# Patient Record
Sex: Female | Born: 1970
Health system: Southern US, Community
[De-identification: ages and names within clinical notes are randomized; demographics above are authoritative.]

## PROBLEM LIST (undated history)

## (undated) DIAGNOSIS — J439 Emphysema, unspecified: Secondary | ICD-10-CM

## (undated) DIAGNOSIS — K219 Gastro-esophageal reflux disease without esophagitis: Secondary | ICD-10-CM

## (undated) DIAGNOSIS — K589 Irritable bowel syndrome without diarrhea: Secondary | ICD-10-CM

## (undated) DIAGNOSIS — N200 Calculus of kidney: Secondary | ICD-10-CM

## (undated) DIAGNOSIS — E739 Lactose intolerance, unspecified: Secondary | ICD-10-CM

## (undated) DIAGNOSIS — Z975 Presence of (intrauterine) contraceptive device: Secondary | ICD-10-CM

## (undated) HISTORY — PX: INTRAUTERINE DEVICE (IUD) INSERTION: SHX5877

## (undated) HISTORY — DX: Emphysema, unspecified: J43.9

## (undated) HISTORY — PX: HERNIA REPAIR: SHX51

## (undated) HISTORY — PX: TONSILLECTOMY: SUR1361

## (undated) HISTORY — DX: Calculus of kidney: N20.0

## (undated) HISTORY — DX: Presence of (intrauterine) contraceptive device: Z97.5

## (undated) HISTORY — PX: SEPTOPLASTY: SUR1290

---

## 1998-02-20 ENCOUNTER — Other Ambulatory Visit: Admission: RE | Admit: 1998-02-20 | Discharge: 1998-02-20 | Payer: Self-pay | Admitting: *Deleted

## 1998-11-29 ENCOUNTER — Inpatient Hospital Stay (HOSPITAL_COMMUNITY): Admission: AD | Admit: 1998-11-29 | Discharge: 1998-12-02 | Payer: Self-pay | Admitting: Obstetrics and Gynecology

## 1999-01-01 ENCOUNTER — Encounter (HOSPITAL_COMMUNITY): Admission: RE | Admit: 1999-01-01 | Discharge: 1999-04-01 | Payer: Self-pay | Admitting: Obstetrics and Gynecology

## 1999-01-06 ENCOUNTER — Other Ambulatory Visit: Admission: RE | Admit: 1999-01-06 | Discharge: 1999-01-06 | Payer: Self-pay | Admitting: Obstetrics and Gynecology

## 1999-09-15 ENCOUNTER — Emergency Department (HOSPITAL_COMMUNITY): Admission: EM | Admit: 1999-09-15 | Discharge: 1999-09-15 | Payer: Self-pay | Admitting: *Deleted

## 1999-09-15 ENCOUNTER — Encounter: Payer: Self-pay | Admitting: *Deleted

## 2000-04-07 ENCOUNTER — Other Ambulatory Visit: Admission: RE | Admit: 2000-04-07 | Discharge: 2000-04-07 | Payer: Self-pay | Admitting: *Deleted

## 2001-06-22 ENCOUNTER — Other Ambulatory Visit: Admission: RE | Admit: 2001-06-22 | Discharge: 2001-06-22 | Payer: Self-pay | Admitting: *Deleted

## 2002-10-11 ENCOUNTER — Other Ambulatory Visit: Admission: RE | Admit: 2002-10-11 | Discharge: 2002-10-11 | Payer: Self-pay | Admitting: Obstetrics and Gynecology

## 2003-07-10 ENCOUNTER — Ambulatory Visit (HOSPITAL_COMMUNITY): Admission: RE | Admit: 2003-07-10 | Discharge: 2003-07-10 | Payer: Self-pay | Admitting: Surgery

## 2003-12-19 ENCOUNTER — Other Ambulatory Visit: Admission: RE | Admit: 2003-12-19 | Discharge: 2003-12-19 | Payer: Self-pay | Admitting: Obstetrics and Gynecology

## 2004-07-29 ENCOUNTER — Emergency Department: Payer: Self-pay | Admitting: Emergency Medicine

## 2005-07-02 ENCOUNTER — Emergency Department: Payer: Self-pay | Admitting: Emergency Medicine

## 2015-03-09 LAB — HM PAP SMEAR: HM Pap smear: NEGATIVE

## 2015-08-19 ENCOUNTER — Emergency Department
Admission: EM | Admit: 2015-08-19 | Discharge: 2015-08-19 | Disposition: A | Discharge status: Home / Self Care | Payer: BLUE CROSS/BLUE SHIELD | Source: Home / Self Care | Attending: Family Medicine | Admitting: Family Medicine

## 2015-08-19 ENCOUNTER — Encounter: Payer: Self-pay | Admitting: *Deleted

## 2015-08-19 DIAGNOSIS — J019 Acute sinusitis, unspecified: Secondary | ICD-10-CM | POA: Diagnosis not present

## 2015-08-19 HISTORY — DX: Irritable bowel syndrome, unspecified: K58.9

## 2015-08-19 MED ORDER — FLUTICASONE PROPIONATE 50 MCG/ACT NA SUSP: 2.0000 | Freq: Every day | NASAL | Status: DC

## 2015-08-19 MED ORDER — CEFDINIR 300 MG PO CAPS: 300.0000 mg | ORAL_CAPSULE | Freq: Two times a day (BID) | ORAL | Status: DC

## 2015-08-19 NOTE — ED Notes (Signed)
Pt c/o nasal congestion, HA, sneezing, ear fullness, and sinus pressure x 2 wks.

## 2015-08-19 NOTE — Discharge Instructions (Signed)
You may take 400-600mg Ibuprofen (Motrin) every 6-8 hours for fever and pain  °Alternate with Tylenol  °You may take 500mg Tylenol every 4-6 hours as needed for fever and pain  °Follow-up with your primary care provider next week for recheck of symptoms if not improving.  °Be sure to drink plenty of fluids and rest, at least 8hrs of sleep a night, preferably more while you are sick. °Return urgent care or go to closest ER if you cannot keep down fluids/signs of dehydration, fever not reducing with Tylenol, difficulty breathing/wheezing, stiff neck, worsening condition, or other concerns (see below)  °Please take antibiotics as prescribed and be sure to complete entire course even if you start to feel better to ensure infection does not come back. ° ° °Sinusitis, Adult °Sinusitis is redness, soreness, and inflammation of the paranasal sinuses. Paranasal sinuses are air pockets within the bones of your face. They are located beneath your eyes, in the middle of your forehead, and above your eyes. In healthy paranasal sinuses, mucus is able to drain out, and air is able to circulate through them by way of your nose. However, when your paranasal sinuses are inflamed, mucus and air can become trapped. This can allow bacteria and other germs to grow and cause infection. °Sinusitis can develop quickly and last only a short time (acute) or continue over a long period (chronic). Sinusitis that lasts for more than 12 weeks is considered chronic. °CAUSES °Causes of sinusitis include: °· Allergies. °· Structural abnormalities, such as displacement of the cartilage that separates your nostrils (deviated septum), which can decrease the air flow through your nose and sinuses and affect sinus drainage. °· Functional abnormalities, such as when the small hairs (cilia) that line your sinuses and help remove mucus do not work properly or are not present. °SIGNS AND SYMPTOMS °Symptoms of acute and chronic sinusitis are the same. The  primary symptoms are pain and pressure around the affected sinuses. Other symptoms include: °· Upper toothache. °· Earache. °· Headache. °· Bad breath. °· Decreased sense of smell and taste. °· A cough, which worsens when you are lying flat. °· Fatigue. °· Fever. °· Thick drainage from your nose, which often is green and may contain pus (purulent). °· Swelling and warmth over the affected sinuses. °DIAGNOSIS °Your health care provider will perform a physical exam. During your exam, your health care provider may perform any of the following to help determine if you have acute sinusitis or chronic sinusitis: °· Look in your nose for signs of abnormal growths in your nostrils (nasal polyps). °· Tap over the affected sinus to check for signs of infection. °· View the inside of your sinuses using an imaging device that has a light attached (endoscope). °If your health care provider suspects that you have chronic sinusitis, one or more of the following tests may be recommended: °· Allergy tests. °· Nasal culture. A sample of mucus is taken from your nose, sent to a lab, and screened for bacteria. °· Nasal cytology. A sample of mucus is taken from your nose and examined by your health care provider to determine if your sinusitis is related to an allergy. °TREATMENT °Most cases of acute sinusitis are related to a viral infection and will resolve on their own within 10 days. Sometimes, medicines are prescribed to help relieve symptoms of both acute and chronic sinusitis. These may include pain medicines, decongestants, nasal steroid sprays, or saline sprays. °However, for sinusitis related to a bacterial infection, your health   care provider will prescribe antibiotic medicines. These are medicines that will help kill the bacteria causing the infection. °Rarely, sinusitis is caused by a fungal infection. In these cases, your health care provider will prescribe antifungal medicine. °For some cases of chronic sinusitis, surgery  is needed. Generally, these are cases in which sinusitis recurs more than 3 times per year, despite other treatments. °HOME CARE INSTRUCTIONS °· Drink plenty of water. Water helps thin the mucus so your sinuses can drain more easily. °· Use a humidifier. °· Inhale steam 3-4 times a day (for example, sit in the bathroom with the shower running). °· Apply a warm, moist washcloth to your face 3-4 times a day, or as directed by your health care provider. °· Use saline nasal sprays to help moisten and clean your sinuses. °· Take medicines only as directed by your health care provider. °· If you were prescribed either an antibiotic or antifungal medicine, finish it all even if you start to feel better. °SEEK IMMEDIATE MEDICAL CARE IF: °· You have increasing pain or severe headaches. °· You have nausea, vomiting, or drowsiness. °· You have swelling around your face. °· You have vision problems. °· You have a stiff neck. °· You have difficulty breathing. °  °This information is not intended to replace advice given to you by your health care provider. Make sure you discuss any questions you have with your health care provider. °  °Document Released: 07/18/2005 Document Revised: 08/08/2014 Document Reviewed: 08/02/2011 °Elsevier Interactive Patient Education ©2016 [employer] ° °Sinus Rinse °WHAT IS A SINUS RINSE? °A sinus rinse is a home treatment. It rinses your sinuses with a mixture of salt and water (saline solution). Sinuses are air-filled spaces in your skull behind the bones of your face and forehead. They open into your nasal cavity. °To do a sinus rinse, you will need: °· Saline solution. °· Neti pot or spray bottle. This releases the saline solution into your nose and through your sinuses. You can buy neti pots and spray bottles at: °¨ Your local pharmacy. °¨ A health food store. °¨ Online. °WHEN WOULD I DO A SINUS RINSE?  °A sinus rinse can help to clear your nasal cavity. It can clear:   °· Mucus. °· Dirt. °· Dust. °· Pollen. °You may do a sinus rinse when you have: °· A cold. °· A virus. °· Allergies. °· A sinus infection. °· A stuffy nose. °If you are considering a sinus rinse: °· Ask your child's doctor before doing a sinus rinse on your child. °· Do not do a sinus rinse if you have had: °¨ Ear or nasal surgery. °¨ An ear infection. °¨ Blocked ears. °HOW DO I DO A SINUS RINSE?  °· Wash your hands. °· Disinfect your device using the directions that came with the device. °· Dry your device. °· Use the solution that comes with your device or one that is sold separately in stores. Follow the mixing directions on the package. °· Fill your device with the amount of saline solution as stated in the device instructions. °· Stand over a sink and tilt your head sideways over the sink. °· Place the spout of the device in your upper nostril (the one closer to the ceiling). °· Gently pour or squeeze the saline solution into the nasal cavity. The liquid should drain to the lower nostril if you are not too congested. °· Gently blow your nose. Blowing too hard may cause ear pain. °· Repeat in the other   nostril. °· Clean and rinse your device with clean water. °· Air-dry your device. °ARE THERE RISKS OF A SINUS RINSE?  °Sinus rinse is normally very safe and helpful. However, there are a few risks, which include:  °· A burning feeling in the sinuses. This may happen if you do not make the saline solution as instructed. Make sure to follow all directions when making the saline solution. °· Infection from unclean water. This is rare, but possible. °· Nasal irritation. °  °This information is not intended to replace advice given to you by your health care provider. Make sure you discuss any questions you have with your health care provider. °  °Document Released: 02/12/2014 Document Reviewed: 02/12/2014 °Elsevier Interactive Patient Education ©2016 [employer] ° ° °

## 2015-08-19 NOTE — ED Provider Notes (Signed)
CSN: [id number]     Arrival date & time 08/19/15  1239 History   First MD Initiated Contact with Patient 08/19/15 1257     Chief Complaint  Patient presents with  . Nasal Congestion   (Consider location/radiation/quality/duration/timing/severity/associated sxs/prior Treatment) HPI Pt is a 45yo female presenting to [facility] with c/o nasal congestion, intermittent headaches that are worse when leaning forward, sinus pressure, ear fullness and sneezing.  She reports she was on amoxicillin  TID for 7 days and did improve but states symptoms worsened 2 days later.  Initially symptoms started about 2 weeks ago.  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 She has had subjective fever with chills, mild nausea without vomiting and loose stools she believes is due to the nasal drainage.  She notes other family members are also sick.  Past Medical History  Diagnosis Date  . IBS (irritable bowel syndrome)    Past Surgical History  Procedure Laterality Date  . Tonsillectomy    . Hernia repair    . Septoplasty     Family History  Problem Relation Age of Onset  . Hypertension Mother   . Hypertension Father   . Thyroid disease Sister    Social History  Substance Use Topics  . Smoking status: Current Every Day Smoker -- 0.50 packs/day    Types: Cigarettes  . Smokeless tobacco: None  . Alcohol Use: Yes     Comment: 1-2 q wk   OB History    No data available     Review of Systems  Constitutional: Positive for fever ( subjective), chills and fatigue.  HENT: Positive for congestion, ear pain (Left), rhinorrhea and sore throat. Negative for trouble swallowing and voice change.   Respiratory: Positive for cough. Negative for shortness of breath.   Cardiovascular: Negative for chest pain and palpitations.  Gastrointestinal: Positive for nausea and diarrhea ( loose stool). Negative for  vomiting and abdominal pain.  Musculoskeletal: Positive for myalgias and arthralgias. Negative for back pain.  Skin: Negative for rash.  Neurological: Positive for dizziness and headaches. Negative for light-headedness.    Allergies  Codeine and Erythromycin  Home Medications   Prior to Admission medications   Medication Sig Start Date End Date Taking? Authorizing Provider  dicyclomine (BENTYL) 10 MG capsule Take 10 mg by mouth 4 (four) times daily -  before meals and at bedtime.   Yes Historical Provider, MD  loratadine (CLARITIN) 10 MG tablet Take 10 mg by mouth daily.   Yes Historical Provider, MD  mirabegron ER (MYRBETRIQ) 25 MG TB24 tablet Take 25 mg by mouth daily.   Yes Historical Provider, MD  phenylephrine (SUDAFED PE) 10 MG TABS tablet Take 10 mg by mouth every 4 (four) hours as needed.   Yes Historical Provider, MD  Probiotic Product (ALIGN PO) Take by mouth.   Yes Historical Provider, MD  cefdinir (OMNICEF) 300 MG capsule Take 1 capsule (300 mg total) by mouth 2 (two) times daily. For 10 days 08/19/15   Junius Finner, PA-C  fluticasone [facility]) 50 MCG/ACT nasal spray Place 2 sprays into both nostrils daily. Use daily for at least 2 weeks, then seasonally as needed. 08/19/15   Junius Finner, PA-C   Meds Ordered and Administered this Visit  Medications - No data to display  BP 111/80 mmHg  Pulse 91  Temp(Src) 98.1 F (36.7 C) (Oral)  Resp 16  Ht  (1.6 m)  Wt 130 lb (58.968 kg)  BMI 23.03 kg/m2  SpO2 98%  LMP 07/23/2015 No data found.   Physical Exam  Constitutional: She appears well-developed and well-nourished. No distress.  HENT:  Head: Normocephalic and atraumatic.  Right Ear: Hearing, tympanic membrane, external ear and ear canal normal.  Left Ear: Hearing, tympanic membrane, external ear and ear canal normal.  Nose: Mucosal edema ( Right side worse than Left) present. Right sinus exhibits maxillary sinus tenderness. Right sinus exhibits no frontal sinus  tenderness. Left sinus exhibits maxillary sinus tenderness and frontal sinus tenderness.  Mouth/Throat: Uvula is midline and mucous membranes are normal. Posterior oropharyngeal erythema present. No oropharyngeal exudate, posterior oropharyngeal edema or tonsillar abscesses.  Eyes: Conjunctivae are normal. No scleral icterus.  Neck: Normal range of motion. Neck supple.  Cardiovascular: Normal rate, regular rhythm and normal heart sounds.   Pulmonary/Chest: Effort normal and breath sounds normal. No respiratory distress. She has no wheezes. She has no rales. She exhibits no tenderness.  Abdominal: Soft. She exhibits no distension and no mass. There is no tenderness. There is no rebound and no guarding.  Musculoskeletal: Normal range of motion.  Lymphadenopathy:    She has cervical adenopathy.  Neurological: She is alert.  Skin: Skin is warm and dry. She is not diaphoretic.  Nursing note and vitals reviewed.   ED Course  Procedures (including critical care time)  Labs Review Labs Reviewed - No data to display  Imaging Review No results found.    MDM   1. Acute rhinosinusitis    Pt c/o worsening sinus congestion and pressure after completing a 7 day course of Amoxicillin.  Due to reported fevers and worsening facial pressure, will start pt on Cefdinir.  Advised pt she can use Flonase daily and it works best if it is used consistently.  Advised pt to use acetaminophen and ibuprofen as needed for fever and pain. Encouraged rest and fluids. F/u with PCP in 7-10 days if not improving, sooner if worsening. Pt verbalized understanding and agreement with tx plan.    Junius Finner, PA-C 08/19/15 1346

## 2016-05-16 DIAGNOSIS — Z6822 Body mass index (BMI) 22.0-22.9, adult: Secondary | ICD-10-CM | POA: Diagnosis not present

## 2016-05-16 DIAGNOSIS — Z01419 Encounter for gynecological examination (general) (routine) without abnormal findings: Secondary | ICD-10-CM | POA: Diagnosis not present

## 2016-05-16 DIAGNOSIS — Z0000 Encounter for general adult medical examination without abnormal findings: Secondary | ICD-10-CM | POA: Diagnosis not present

## 2016-06-20 DIAGNOSIS — Z1231 Encounter for screening mammogram for malignant neoplasm of breast: Secondary | ICD-10-CM | POA: Diagnosis not present

## 2016-06-20 LAB — HM MAMMOGRAPHY

## 2017-01-02 DIAGNOSIS — Z3202 Encounter for pregnancy test, result negative: Secondary | ICD-10-CM | POA: Diagnosis not present

## 2017-01-02 DIAGNOSIS — Z30433 Encounter for removal and reinsertion of intrauterine contraceptive device: Secondary | ICD-10-CM | POA: Diagnosis not present

## 2017-01-30 ENCOUNTER — Encounter: Payer: Self-pay | Admitting: *Deleted

## 2017-01-30 ENCOUNTER — Emergency Department
Admission: EM | Admit: 2017-01-30 | Discharge: 2017-01-30 | Disposition: A | Discharge status: Home / Self Care | Payer: BLUE CROSS/BLUE SHIELD | Source: Home / Self Care | Attending: Family Medicine | Admitting: Family Medicine

## 2017-01-30 DIAGNOSIS — R350 Frequency of micturition: Secondary | ICD-10-CM | POA: Diagnosis not present

## 2017-01-30 LAB — POCT URINALYSIS DIP (MANUAL ENTRY)
BILIRUBIN UA: NEGATIVE
BILIRUBIN UA: NEGATIVE mg/dL
Blood, UA: NEGATIVE
GLUCOSE UA: NEGATIVE mg/dL
Leukocytes, UA: NEGATIVE
Nitrite, UA: NEGATIVE
Protein Ur, POC: NEGATIVE mg/dL
SPEC GRAV UA: 1.015 (ref 1.010–1.025)
Urobilinogen, UA: 0.2 E.U./dL
pH, UA: 6.5 (ref 5.0–8.0)

## 2017-01-30 MED ORDER — NITROFURANTOIN MONOHYD MACRO 100 MG PO CAPS: 100.0000 mg | ORAL_CAPSULE | Freq: Two times a day (BID) | ORAL | 0 refills | Status: DC

## 2017-01-30 NOTE — Discharge Instructions (Signed)
Increase fluid intake.  Begin antibiotic if urinary burning and pain occur. If symptoms become significantly worse during the night or over the weekend, proceed to the local emergency room.

## 2017-01-30 NOTE — ED Provider Notes (Signed)
[facility]    CSN: [id number] Arrival date & time: 01/30/17  1415     History   Chief Complaint Chief Complaint  Patient presents with  . Flank Pain  . Fatigue  . Pelvic Pain    HPI Raven Foster is a 46 y.o. female.   Patient awoke two days ago with fatigue and left lower back pain that radiated to her lower abdomen.  She has had increased urinary frequency and urgency.  She had nausea today (no vomiting) and possibly chills.  She has an IUD in place, and had some spotting on June 4.  She has a history of stone in her left kidney.   The history is provided by the patient.  Flank Pain  This is a new problem. The current episode started more than 2 days ago. The problem occurs constantly. The problem has been gradually improving. Pertinent negatives include no abdominal pain. Nothing aggravates the symptoms. Nothing relieves the symptoms. She has tried nothing for the symptoms.  Pelvic Pain  Pertinent negatives include no abdominal pain.    Past Medical History:  Diagnosis Date  . IBS (irritable bowel syndrome)     There are no active problems to display for this patient.   Past Surgical History:  Procedure Laterality Date  . HERNIA REPAIR    . SEPTOPLASTY    . TONSILLECTOMY      OB History    No data available       Home Medications    Prior to Admission medications   Medication Sig Start Date End Date Taking? Authorizing Provider  cyclobenzaprine (FLEXERIL) 5 MG tablet Take 5 mg by mouth 3 (three) times daily as needed for muscle spasms.   Yes [provider]  dicyclomine (BENTYL) 10 MG capsule Take 10 mg by mouth 4 (four) times daily -  before meals and at bedtime.   Yes [provider]  loratadine (CLARITIN) 10 MG tablet Take 10 mg by mouth daily.    [provider]  mirabegron ER (MYRBETRIQ) 25 MG TB24 tablet Take 25 mg by mouth daily.    [provider]  nitrofurantoin, macrocrystal-monohydrate,  (MACROBID) 100 MG capsule Take 1 capsule (100 mg total) by mouth 2 (two) times daily. Take with food. 01/30/17   Lattie HawBeese, Stephen A, MD  Probiotic Product (ALIGN PO) Take by mouth.    [provider]    Family History Family History  Problem Relation Age of Onset  . Hypertension Mother   . Hypertension Father   . Thyroid disease Sister     Social History Social History  Substance Use Topics  . Smoking status: Current Every Day Smoker    Packs/day: 0.50    Types: Cigarettes  . Smokeless tobacco: Current User  . Alcohol use Yes     Comment: 1-2 q wk     Allergies   Codeine and Erythromycin   Review of Systems Review of Systems  Gastrointestinal: Negative for abdominal pain.  Genitourinary: Positive for dysuria, flank pain, frequency, pelvic pain and urgency. Negative for hematuria, vaginal bleeding and vaginal discharge.  All other systems reviewed and are negative.    Physical Exam Triage Vital Signs ED Triage Vitals [01/30/17 1500]  Enc Vitals Group     BP 106/72     Pulse Rate 68     Resp      Temp 99.1 F (37.3 C)     Temp Source Oral     SpO2 100 %  Weight 133 lb (60.3 kg)     Height      Head Circumference      Peak Flow      Pain Score 4     Pain Loc      Pain Edu?      Excl. in GC?    No data found.   Updated Vital Signs BP 106/72 (BP Location: Left Arm)   Pulse 68   Temp 99.1 F (37.3 C) (Oral)   Wt 133 lb (60.3 kg)   SpO2 100%   BMI 23.56 kg/m   Visual Acuity Right Eye Distance:   Left Eye Distance:   Bilateral Distance:    Right Eye Near:   Left Eye Near:    Bilateral Near:     Physical Exam Nursing notes and Vital Signs reviewed. Appearance:  Patient appears stated age, and in no acute distress.    Eyes:  Pupils are equal, round, and reactive to light and accomodation.  Extraocular movement is intact.  Conjunctivae are not inflamed   Pharynx:  Normal; moist mucous membranes  Neck:  Supple.  No adenopathy Lungs:   Clear to auscultation.  Breath sounds are equal.  Moving air well. Heart:  Regular rate and rhythm without murmurs, rubs, or gallops.  Abdomen:   Mild tenderness over bladder without masses or hepatosplenomegaly.  Bowel sounds are present.  No CVA or flank tenderness.  Extremities:  No edema.  Skin:  No rash present.     UC Treatments / Results  Labs (all labs ordered are listed, but only abnormal results are displayed) Labs Reviewed  POCT URINALYSIS DIP (MANUAL ENTRY) negative    EKG  EKG Interpretation None       Radiology No results found.  Procedures Procedures (including critical care time)  Medications Ordered in UC Medications - No data to display   Initial Impression / Assessment and Plan / UC Course  I have reviewed the triage vital signs and the nursing notes.  Pertinent labs & imaging results that were available during my care of the patient were reviewed by me and considered in my medical decision making (see chart for details).    Note normal urinalysis.  No evidence UTI today.  Urine culture pending. Given Macrobid (Rx to hold); await urine culture results. Increase fluid intake.  Begin antibiotic if urinary burning and pain occur. If symptoms become significantly worse during the night or over the weekend, proceed to the local emergency room.  Followup with Family Doctor if not improved in one week.     Final Clinical Impressions(s) / UC Diagnoses   Final diagnoses:  Urinary frequency    New Prescriptions New Prescriptions   NITROFURANTOIN, MACROCRYSTAL-MONOHYDRATE, (MACROBID) 100 MG CAPSULE    Take 1 capsule (100 mg total) by mouth 2 (two) times daily. Take with food.     Lattie Haw, MD 02/07/17 1910

## 2017-01-30 NOTE — ED Triage Notes (Signed)
Patient c/o left flank pain x 2 days, urinary frequency and now bladder pain. She noted excessive fatigue starting 2 days ago. Her urologist Dr. Benancio DeedsNewsome told her she has a stone in the left kidney.

## 2017-02-01 ENCOUNTER — Telehealth: Payer: Self-pay

## 2017-02-01 LAB — URINE CULTURE: Organism ID, Bacteria: NO GROWTH

## 2017-02-01 NOTE — Telephone Encounter (Signed)
Feeling better.  UCX results given.

## 2017-02-20 DIAGNOSIS — R1031 Right lower quadrant pain: Secondary | ICD-10-CM | POA: Diagnosis not present

## 2017-02-20 DIAGNOSIS — Z30431 Encounter for routine checking of intrauterine contraceptive device: Secondary | ICD-10-CM | POA: Diagnosis not present

## 2017-02-20 DIAGNOSIS — R1903 Right lower quadrant abdominal swelling, mass and lump: Secondary | ICD-10-CM | POA: Diagnosis not present

## 2017-05-29 DIAGNOSIS — Z9189 Other specified personal risk factors, not elsewhere classified: Secondary | ICD-10-CM | POA: Diagnosis not present

## 2017-05-29 DIAGNOSIS — Z01419 Encounter for gynecological examination (general) (routine) without abnormal findings: Secondary | ICD-10-CM | POA: Diagnosis not present

## 2017-05-29 DIAGNOSIS — Z0000 Encounter for general adult medical examination without abnormal findings: Secondary | ICD-10-CM | POA: Diagnosis not present

## 2017-05-29 DIAGNOSIS — Z6823 Body mass index (BMI) 23.0-23.9, adult: Secondary | ICD-10-CM | POA: Diagnosis not present

## 2017-06-06 DIAGNOSIS — J300 Vasomotor rhinitis: Secondary | ICD-10-CM | POA: Diagnosis not present

## 2017-06-06 DIAGNOSIS — J310 Chronic rhinitis: Secondary | ICD-10-CM | POA: Diagnosis not present

## 2017-08-11 ENCOUNTER — Emergency Department
Admission: EM | Admit: 2017-08-11 | Discharge: 2017-08-11 | Disposition: A | Discharge status: Home / Self Care | Payer: BLUE CROSS/BLUE SHIELD | Source: Home / Self Care | Attending: Family Medicine | Admitting: Family Medicine

## 2017-08-11 ENCOUNTER — Encounter: Payer: Self-pay | Admitting: Emergency Medicine

## 2017-08-11 DIAGNOSIS — J069 Acute upper respiratory infection, unspecified: Secondary | ICD-10-CM | POA: Diagnosis not present

## 2017-08-11 DIAGNOSIS — R053 Chronic cough: Secondary | ICD-10-CM

## 2017-08-11 DIAGNOSIS — R05 Cough: Secondary | ICD-10-CM

## 2017-08-11 MED ORDER — CEFDINIR 300 MG PO CAPS: 300.0000 mg | ORAL_CAPSULE | Freq: Two times a day (BID) | ORAL | 0 refills | Status: DC

## 2017-08-11 MED ORDER — PREDNISONE 20 MG PO TABS: ORAL_TABLET | ORAL | 0 refills | Status: DC

## 2017-08-11 MED ORDER — ALBUTEROL SULFATE HFA 108 (90 BASE) MCG/ACT IN AERS: 1.0000 | INHALATION_SPRAY | Freq: Four times a day (QID) | RESPIRATORY_TRACT | 0 refills | Status: DC | PRN

## 2017-08-11 MED ORDER — GUAIFENESIN 100 MG/5ML PO LIQD: 200.0000 mg | ORAL | 0 refills | Status: DC | PRN

## 2017-08-11 NOTE — ED Triage Notes (Signed)
Pt c/o facial pain, cough with mucous and congestion for the last 2 days. Afebrile.

## 2017-08-11 NOTE — ED Provider Notes (Signed)
[facility]    CSN: [id number] Arrival date & time: 08/11/17  1444     History   Chief Complaint Chief Complaint  Patient presents with  . Facial Pain    HPI Raven Foster is a 47 y.o. female.   HPI Raven Foster is a 47 y.o. female presenting to UC with c/o several weeks of nasal congestion, scratchy throat and mildly productive cough.  She was started on Azithromycin mid-December by her dentist.  Symptoms improved but never completely resolved.  Denies fever, chills, n/v/d. She is requesting to try prednisone and liquid Mucinex.     Past Medical History:  Diagnosis Date  . IBS (irritable bowel syndrome)     There are no active problems to display for this patient.   Past Surgical History:  Procedure Laterality Date  . HERNIA REPAIR    . SEPTOPLASTY    . TONSILLECTOMY      OB History    No data available       Home Medications    Prior to Admission medications   Medication Sig Start Date End Date Taking? Authorizing Provider  albuterol (PROVENTIL HFA;VENTOLIN HFA) 108 (90 Base) MCG/ACT inhaler Inhale 1-2 puffs into the lungs every 6 (six) hours as needed for wheezing or shortness of breath. 08/11/17   Lurene ShadowPhelps, Theordore Cisnero O, PA-C  cefdinir (OMNICEF) 300 MG capsule Take 1 capsule (300 mg total) by mouth 2 (two) times daily. 08/11/17   Lurene ShadowPhelps, Praise Stennett O, PA-C  cyclobenzaprine (FLEXERIL) 5 MG tablet Take 5 mg by mouth 3 (three) times daily as needed for muscle spasms.    [provider]  dicyclomine (BENTYL) 10 MG capsule Take 10 mg by mouth 4 (four) times daily -  before meals and at bedtime.    [provider]  guaiFENesin (ROBITUSSIN) 100 MG/5ML liquid Take 10-20 mLs (200-400 mg total) by mouth every 4 (four) hours as needed for cough or congestion. 08/11/17   Lurene ShadowPhelps, Alexandre Faries O, PA-C  loratadine (CLARITIN) 10 MG tablet Take 10 mg by mouth daily.    [provider]  mirabegron ER (MYRBETRIQ) 25 MG TB24 tablet Take 25 mg by mouth  daily.    [provider]  nitrofurantoin, macrocrystal-monohydrate, (MACROBID) 100 MG capsule Take 1 capsule (100 mg total) by mouth 2 (two) times daily. Take with food. 01/30/17   Lattie HawBeese, Stephen A, MD  predniSONE (DELTASONE) 20 MG tablet 3 tabs po day one, then 2 po daily x 4 days 08/11/17   Lurene ShadowPhelps, Linzi Ohlinger O, PA-C  Probiotic Product (ALIGN PO) Take by mouth.    [provider]    Family History Family History  Problem Relation Age of Onset  . Hypertension Mother   . Hypertension Father   . Thyroid disease Sister     Social History Social History   Tobacco Use  . Smoking status: Current Every Day Smoker    Packs/day: 0.50    Types: Cigarettes  . Smokeless tobacco: Current User  Substance Use Topics  . Alcohol use: Yes    Comment: 1-2 q wk  . Drug use: No     Allergies   Codeine; Erythromycin; and Prednisone   Review of Systems Review of Systems  Constitutional: Negative for chills and fever.  HENT: Positive for congestion, postnasal drip, rhinorrhea, sinus pressure and sore throat (scratchy). Negative for ear pain, sinus pain, trouble swallowing and voice change.   Respiratory: Positive for cough. Negative for shortness of breath.   Cardiovascular: Negative for  chest pain and palpitations.  Gastrointestinal: Negative for abdominal pain, diarrhea, nausea and vomiting.  Musculoskeletal: Negative for arthralgias, back pain and myalgias.  Skin: Negative for rash.  Neurological: Negative for dizziness, light-headedness and headaches.     Physical Exam Triage Vital Signs ED Triage Vitals  Enc Vitals Group     BP 08/11/17 1516 127/82     Pulse Rate 08/11/17 1516 76     Resp --      Temp 08/11/17 1516 97.8 F (36.6 C)     Temp Source 08/11/17 1516 Oral     SpO2 08/11/17 1516 100 %     Weight 08/11/17 1516 132 lb (59.9 kg)     Height --      Head Circumference --      Peak Flow --      Pain Score 08/11/17 1517 0     Pain Loc --      Pain Edu? --       Excl. in GC? --    No data found.  Updated Vital Signs BP 127/82 (BP Location: Right Arm)   Pulse 76   Temp 97.8 F (36.6 C) (Oral)   Wt 132 lb (59.9 kg)   SpO2 100%   BMI 23.38 kg/m   Visual Acuity Right Eye Distance:   Left Eye Distance:   Bilateral Distance:    Right Eye Near:   Left Eye Near:    Bilateral Near:     Physical Exam  Constitutional: She is oriented to person, place, and time. She appears well-developed and well-nourished. No distress.  HENT:  Head: Normocephalic and atraumatic.  Right Ear: Tympanic membrane normal.  Left Ear: Tympanic membrane normal.  Nose: Nose normal. Right sinus exhibits no maxillary sinus tenderness and no frontal sinus tenderness. Left sinus exhibits no maxillary sinus tenderness and no frontal sinus tenderness.  Mouth/Throat: Uvula is midline, oropharynx is clear and moist and mucous membranes are normal.  Eyes: EOM are normal.  Neck: Normal range of motion. Neck supple.  Cardiovascular: Normal rate and regular rhythm.  Pulmonary/Chest: Effort normal and breath sounds normal. No stridor. No respiratory distress. She has no wheezes. She has no rales.  Musculoskeletal: Normal range of motion.  Lymphadenopathy:    She has no cervical adenopathy.  Neurological: She is alert and oriented to person, place, and time.  Skin: Skin is warm and dry. She is not diaphoretic.  Psychiatric: She has a normal mood and affect. Her behavior is normal.  Nursing note and vitals reviewed.    UC Treatments / Results  Labs (all labs ordered are listed, but only abnormal results are displayed) Labs Reviewed - No data to display  EKG  EKG Interpretation None       Radiology No results found.  Procedures Procedures (including critical care time)  Medications Ordered in UC Medications - No data to display   Initial Impression / Assessment and Plan / UC Course  I have reviewed the triage vital signs and the nursing notes.  Pertinent  labs & imaging results that were available during my care of the patient were reviewed by me and considered in my medical decision making (see chart for details).    Pt recently completed a course of Azithromycin Will try symptomatic treatment first Prescription to hold with expiration date for cefdinir. Pt to fill if persistent fever develops or not improving in 1 week.  F/u with PCP in 1-2 weeks if needed.   Final Clinical Impressions(s) / UC  Diagnoses   Final diagnoses:  Acute upper respiratory infection  Persistent cough    ED Discharge Orders        Ordered    predniSONE (DELTASONE) 20 MG tablet     08/11/17 1606    guaiFENesin (ROBITUSSIN) 100 MG/5ML liquid  Every 4 hours PRN     08/11/17 1606    albuterol (PROVENTIL HFA;VENTOLIN HFA) 108 (90 Base) MCG/ACT inhaler  Every 6 hours PRN     08/11/17 1606    cefdinir (OMNICEF) 300 MG capsule  2 times daily    Comments:  Void after 08/30/17   08/11/17 1606       Controlled Substance Prescriptions  Controlled Substance Registry consulted? Not Applicable   Rolla Plate 08/11/17 1622

## 2017-08-31 ENCOUNTER — Emergency Department (INDEPENDENT_AMBULATORY_CARE_PROVIDER_SITE_OTHER): Payer: BLUE CROSS/BLUE SHIELD

## 2017-08-31 ENCOUNTER — Emergency Department
Admission: EM | Admit: 2017-08-31 | Discharge: 2017-08-31 | Disposition: A | Discharge status: Home / Self Care | Payer: BLUE CROSS/BLUE SHIELD | Source: Home / Self Care | Attending: Emergency Medicine | Admitting: Emergency Medicine

## 2017-08-31 ENCOUNTER — Other Ambulatory Visit: Payer: Self-pay

## 2017-08-31 ENCOUNTER — Encounter: Payer: Self-pay | Admitting: Emergency Medicine

## 2017-08-31 DIAGNOSIS — J439 Emphysema, unspecified: Secondary | ICD-10-CM | POA: Diagnosis not present

## 2017-08-31 DIAGNOSIS — T783XXA Angioneurotic edema, initial encounter: Secondary | ICD-10-CM

## 2017-08-31 DIAGNOSIS — Q676 Pectus excavatum: Secondary | ICD-10-CM | POA: Diagnosis not present

## 2017-08-31 DIAGNOSIS — R05 Cough: Secondary | ICD-10-CM | POA: Diagnosis not present

## 2017-08-31 DIAGNOSIS — R9389 Abnormal findings on diagnostic imaging of other specified body structures: Secondary | ICD-10-CM

## 2017-08-31 HISTORY — DX: Gastro-esophageal reflux disease without esophagitis: K21.9

## 2017-08-31 HISTORY — DX: Lactose intolerance, unspecified: E73.9

## 2017-08-31 LAB — POCT CBC W AUTO DIFF (K'VILLE URGENT CARE)

## 2017-08-31 LAB — COMPREHENSIVE METABOLIC PANEL
AG RATIO: 1.7 (calc) (ref 1.0–2.5)
ALKALINE PHOSPHATASE (APISO): 64 U/L (ref 33–115)
ALT: 13 U/L (ref 6–29)
AST: 15 U/L (ref 10–35)
Albumin: 4.5 g/dL (ref 3.6–5.1)
BUN: 8 mg/dL (ref 7–25)
CHLORIDE: 106 mmol/L (ref 98–110)
CO2: 27 mmol/L (ref 20–32)
Calcium: 10.3 mg/dL — ABNORMAL HIGH (ref 8.6–10.2)
Creat: 0.66 mg/dL (ref 0.50–1.10)
GLUCOSE: 96 mg/dL (ref 65–99)
Globulin: 2.6 g/dL (calc) (ref 1.9–3.7)
Potassium: 4.9 mmol/L (ref 3.5–5.3)
Sodium: 138 mmol/L (ref 135–146)
Total Bilirubin: 0.6 mg/dL (ref 0.2–1.2)
Total Protein: 7.1 g/dL (ref 6.1–8.1)

## 2017-08-31 MED ORDER — FAMOTIDINE 20 MG PO TABS
20.0000 mg | ORAL_TABLET | Freq: Every day | ORAL | Status: DC
  Administered 2017-08-31 (×3): 20 mg via ORAL

## 2017-08-31 NOTE — ED Triage Notes (Signed)
Patient has ongoing congestion in her chest which she was treated for 08/11/17; yesterday noticed edema upper lip around 1430; no new allergens with possible exception of new brand yogurt. Has not taken benadryl. No dyspnea.

## 2017-08-31 NOTE — Discharge Instructions (Addendum)
Take Claritin 10 mg 1 a day. Take Pepcid 20 mg 1 a day. Apply ice to your upper lip as needed for swelling. Please go to the emergency room for increased swelling or respiratory problems.

## 2017-08-31 NOTE — ED Provider Notes (Addendum)
[facility]    CSN: [id number] Arrival date & time: 08/31/17  0857     History   Chief Complaint Chief Complaint  Patient presents with  . Oral Swelling    upper lip  . Nasal Congestion    HPI Raven Foster is a 47 y.o. female.  Patient has been under treatment for respiratory infections off and on for the last 2 months. In December she took a Z-Pak. Following this she was seen and treated with prednisone and Omnicef for a respiratory tract infection. She seemed to be doing well and recently underwent allergy testing which was all negative. Yesterday at work at 2:00 she had a new type of yogurt with pomegranate she bought from [employer]. This was a new product for her. Proximally 30 minutes later she noted swelling of her left upper lip. She was able to finish work. She brings with her pictures with her fall of the progression of the swelling she develop. By evening yewhich are likely present which would order for the blood work at chest x-raysterday the swelling was more severe and early this morning she had swelling which extended over the entire upper lip. She has not had any stridor or shortness of breath. She denies cough with this. She has not had any urticaria.  HPI  Past Medical History:  Diagnosis Date  . GERD (gastroesophageal reflux disease)   . IBS (irritable bowel syndrome)   . Lactose intolerance     There are no active problems to display for this patient.   Past Surgical History:  Procedure Laterality Date  . HERNIA REPAIR    . SEPTOPLASTY    . TONSILLECTOMY      OB History    No data available       Home Medications    Prior to Admission medications   Medication Sig Start Date End Date Taking? Authorizing Provider  albuterol (PROVENTIL HFA;VENTOLIN HFA) 108 (90 Base) MCG/ACT inhaler Inhale 1-2 puffs into the lungs every 6 (six) hours as needed for wheezing or shortness of breath. 08/11/17   Lurene ShadowPhelps, Erin O, PA-C  cefdinir (OMNICEF) 300  MG capsule Take 1 capsule (300 mg total) by mouth 2 (two) times daily. 08/11/17   Lurene ShadowPhelps, Erin O, PA-C  cyclobenzaprine (FLEXERIL) 5 MG tablet Take 5 mg by mouth 3 (three) times daily as needed for muscle spasms.    [provider]  dicyclomine (BENTYL) 10 MG capsule Take 10 mg by mouth 4 (four) times daily -  before meals and at bedtime.    [provider]  guaiFENesin (ROBITUSSIN) 100 MG/5ML liquid Take 10-20 mLs (200-400 mg total) by mouth every 4 (four) hours as needed for cough or congestion. 08/11/17   Lurene ShadowPhelps, Erin O, PA-C  loratadine (CLARITIN) 10 MG tablet Take 10 mg by mouth daily.    [provider]  mirabegron ER (MYRBETRIQ) 25 MG TB24 tablet Take 25 mg by mouth daily.    [provider]  nitrofurantoin, macrocrystal-monohydrate, (MACROBID) 100 MG capsule Take 1 capsule (100 mg total) by mouth 2 (two) times daily. Take with food. 01/30/17   Lattie HawBeese, Stephen A, MD  predniSONE (DELTASONE) 20 MG tablet 3 tabs po day one, then 2 po daily x 4 days 08/11/17   Lurene ShadowPhelps, Erin O, PA-C  Probiotic Product (ALIGN PO) Take by mouth.    [provider]    Family History Family History  Problem Relation Age of Onset  . Hypertension Mother   .  Hypertension Father   . Thyroid disease Sister     Social History Social History   Tobacco Use  . Smoking status: Current Every Day Smoker    Packs/day: 0.50    Types: Cigarettes  . Smokeless tobacco: Current User  Substance Use Topics  . Alcohol use: Yes    Comment: 1-2 q wk  . Drug use: No     Allergies   Patient has no active allergies.   Review of Systems Review of Systems  Constitutional: Negative.   HENT: Positive for congestion, facial swelling and postnasal drip. Negative for drooling, mouth sores, sinus pain and trouble swallowing.   Eyes: Negative.   Respiratory: Negative.   Cardiovascular: Negative.   Gastrointestinal: Negative.   Endocrine: Negative.      Patient has a history of food  intolerance.  Physical Exam Triage Vital Signs ED Triage Vitals  Enc Vitals Group     BP 08/31/17 0936 100/69     Pulse Rate 08/31/17 0936 92     Resp 08/31/17 0936 16     Temp 08/31/17 0936 97.9 F (36.6 C)     Temp Source 08/31/17 0936 Oral     SpO2 08/31/17 0936 100 %     Weight 08/31/17 0937 132 lb (59.9 kg)     Height 08/31/17 0937 5\' 3"  (1.6 m)     Head Circumference --      Peak Flow --      Pain Score 08/31/17 0937 0     Pain Loc --      Pain Edu? --      Excl. in GC? --    No data found.  Updated Vital Signs BP 100/69 (BP Location: Right Arm)   Pulse 92   Temp 97.9 F (36.6 C) (Oral)   Resp 16   Ht 5\' 3"  (1.6 m)   Wt 132 lb (59.9 kg)   LMP 07/23/2015   SpO2 100%   BMI 23.38 kg/m   Visual Acuity Right Eye Distance:   Left Eye Distance:   Bilateral Distance:    Right Eye Near:   Left Eye Near:    Bilateral Near:     Physical Exam  Constitutional: She appears well-developed and well-nourished.  HENT:  Head: Normocephalic and atraumatic.  Right Ear: External ear normal.  Left Ear: External ear normal.  There is swelling of the upper lip. This is worse on the left. There are no ulcerations seen. There is no swelling of the uvula or tongue.  Neck: Normal range of motion. No thyromegaly present.  Cardiovascular: Normal rate and regular rhythm.  Pulmonary/Chest: Effort normal and breath sounds normal. No stridor. No respiratory distress. She has no wheezes. She has no rales. She exhibits no tenderness.  Skin: Skin is warm and dry.  There are no hives or urticaria. There is no swelling of the hands or feet.     UC Treatments / Results  Labs (all labs ordered are listed, but only abnormal results are displayed) Labs Reviewed  CBC  COMPREHENSIVE METABOLIC PANEL    EKG  EKG Interpretation None       Radiology No results found.  Procedures Procedures (including critical care time)  Medications Ordered in UC Medications - No data to  display   Initial Impression / Assessment and Plan / UC Course  I have reviewed the triage vital signs and the nursing notes.  Pertinent labs & imaging results that were available during my care of the patient were  reviewed by me and considered in my medical decision making (see chart for details). The patient presents with onset at 2:30 yesterday afternoon of swelling of the upper lip. The only new exposure she has is too food line pomegranate yogurt which she had for the first time yesterday. She has no previous history of any type of food allergies. She is on no medications. She did not take any medications.She will take Claritin when she gets home she was given 20 mg of Pepcid here. She is concerned about an underlying problem since she is been ill off and on for the last 8 weeks. I suspect this is more likely related to some type of food allergy to the [employer] yogurt she ate.chest x-ray report returned with an abnormal area in the right hilum. This was discussed with the radiologist will proceed with a CT chest no contrast. Contrast not given due to patient's current problem with angioedema.CT chest returned showing emphysematous changes and mild compression of the right ventricle secondary to her pectus deformity. No mass was seen. She will follow-up with Dr. Denyse Amass regarding assistance to help with smoking cessation. She was given the danger signs to watch for regarding angioedema.       Final Clinical Impressions(s) / UC Diagnoses   Final diagnoses:  Angioedema of lips, initial encounter    ED Discharge Orders    None       Controlled Substance Prescriptions  Controlled Substance Registry consulted? Not Applicable   Collene Gobble, MD 08/31/17 1053    Collene Gobble, MD 08/31/17 [phone]

## 2017-09-01 ENCOUNTER — Telehealth: Payer: Self-pay

## 2017-09-01 NOTE — Telephone Encounter (Signed)
Left VM with lab results and contact information if any questions or concerns.

## 2017-09-04 NOTE — Telephone Encounter (Signed)
Called pt, left message on VM with lab results and that Dr. Cleta Albertsaub wants her to follow up with PCP.

## 2017-09-08 ENCOUNTER — Ambulatory Visit: Payer: BLUE CROSS/BLUE SHIELD | Admitting: Physician Assistant

## 2017-09-08 ENCOUNTER — Encounter: Payer: Self-pay | Admitting: Physician Assistant

## 2017-09-08 VITALS — BP 123/76 | HR 98 | Wt 134.0 lb

## 2017-09-08 DIAGNOSIS — E7800 Pure hypercholesterolemia, unspecified: Secondary | ICD-10-CM

## 2017-09-08 DIAGNOSIS — F17200 Nicotine dependence, unspecified, uncomplicated: Secondary | ICD-10-CM | POA: Diagnosis not present

## 2017-09-08 DIAGNOSIS — N951 Menopausal and female climacteric states: Secondary | ICD-10-CM | POA: Insufficient documentation

## 2017-09-08 DIAGNOSIS — Z975 Presence of (intrauterine) contraceptive device: Secondary | ICD-10-CM | POA: Diagnosis not present

## 2017-09-08 DIAGNOSIS — N3281 Overactive bladder: Secondary | ICD-10-CM | POA: Insufficient documentation

## 2017-09-08 DIAGNOSIS — J439 Emphysema, unspecified: Secondary | ICD-10-CM

## 2017-09-08 DIAGNOSIS — Z23 Encounter for immunization: Secondary | ICD-10-CM | POA: Diagnosis not present

## 2017-09-08 DIAGNOSIS — J300 Vasomotor rhinitis: Secondary | ICD-10-CM | POA: Insufficient documentation

## 2017-09-08 DIAGNOSIS — N200 Calculus of kidney: Secondary | ICD-10-CM | POA: Insufficient documentation

## 2017-09-08 DIAGNOSIS — Z7689 Persons encountering health services in other specified circumstances: Secondary | ICD-10-CM | POA: Diagnosis not present

## 2017-09-08 DIAGNOSIS — Z1329 Encounter for screening for other suspected endocrine disorder: Secondary | ICD-10-CM | POA: Diagnosis not present

## 2017-09-08 HISTORY — DX: Presence of (intrauterine) contraceptive device: Z97.5

## 2017-09-08 MED ORDER — ALBUTEROL SULFATE HFA 108 (90 BASE) MCG/ACT IN AERS: 1.0000 | INHALATION_SPRAY | Freq: Four times a day (QID) | RESPIRATORY_TRACT | 0 refills | Status: DC | PRN

## 2017-09-08 MED ORDER — UMECLIDINIUM-VILANTEROL 62.5-25 MCG/INH IN AEPB: 1.0000 | INHALATION_SPRAY | Freq: Every day | RESPIRATORY_TRACT | 2 refills | Status: DC

## 2017-09-08 NOTE — Progress Notes (Signed)
HPI:                                                                Raven LacrosseBeverly B Foster is a 47 y.o. female who [facility] today to establish care  Current concerns: COPD  Patient referred here today from urgent care for new diagnosis of emphysema found on chest CT on 08/2017. She has been smoking 0.5 ppd for approximately 30 years. States she has never had any pulmonary symptoms until the last 2 months. She was treated for respiratory infections twice in December/January with Azithromycin and Cefdinir. Continues to endorse non-productive cough and intermittent dyspnea. Denies consitutional symptoms, hemoptysis, chest pain.  Reports she had a mammogram in 2017. She does not want to do annual breast cancer screening.  Depression screen PHQ 2/9 09/08/2017  Decreased Interest 0  Down, Depressed, Hopeless 0  PHQ - 2 Score 0    No flowsheet data found.    Past Medical History:  Diagnosis Date  . GERD (gastroesophageal reflux disease)   . IBS (irritable bowel syndrome)   . IUD (intrauterine device) in place 09/08/2017   Paraguard  . Kidney stone   . Lactose intolerance   . Pulmonary emphysema (HCC)    Past Surgical History:  Procedure Laterality Date  . HERNIA REPAIR    . INTRAUTERINE DEVICE (IUD) INSERTION    . SEPTOPLASTY    . TONSILLECTOMY     Social History   Tobacco Use  . Smoking status: Current Every Day Smoker    Packs/day: 0.50    Years: 30.00    Pack years: 15.00    Types: Cigarettes  . Smokeless tobacco: Current User  Substance Use Topics  . Alcohol use: Yes    Comment: 1-2 q wk   family history includes COPD in her mother; Hypertension in her father and mother; Thyroid disease in her sister.    ROS: Review of Systems  Gastrointestinal: Positive for abdominal pain (IBS) and heartburn.  Musculoskeletal: Positive for myalgias.  Endo/Heme/Allergies: Bruises/bleeds easily.     Medications: Current  Outpatient Medications  Medication Sig Dispense Refill  . albuterol (PROVENTIL HFA;VENTOLIN HFA) 108 (90 Base) MCG/ACT inhaler Inhale 1-2 puffs into the lungs every 6 (six) hours as needed for wheezing or shortness of breath. 1 Inhaler 0  . umeclidinium-vilanterol (ANORO ELLIPTA) 62.5-25 MCG/INH AEPB Inhale 1 puff into the lungs daily. 60 each 2   No current facility-administered medications for this visit.    No Known Allergies     Objective:  BP 123/76   Pulse 98   Wt 134 lb (60.8 kg)   LMP 07/23/2015   BMI 23.74 kg/m  Gen:  alert, not ill-appearing, no distress, appears younger than stated age HEENT: head normocephalic without obvious abnormality, conjunctiva and cornea clear, trachea midline Pulm: Normal work of breathing, normal phonation, inspiratory wheezes bilaterally CV: Normal rate, regular rhythm, s1 and s2 distinct, no murmurs, clicks or rubs  Neuro: alert and oriented x 3, no tremor MSK: extremities atraumatic, normal gait and station Skin: intact, no rashes on exposed skin, no jaundice, no cyanosis Psych: well-groomed, cooperative, good eye contact, euthymic mood, affect mood-congruent, speech is articulate, and thought processes clear and goal-directed    No results  found for this or any previous visit (from the past 72 hour(s)). No results found.    Assessment and Plan: 47 y.o. female with   1. Encounter to establish care - reviewed PMH, PSH, PFH, medications and allergies - reviewed health maintenance - Pap UTD per patient, requesting records from [place] - Mammogram completed 06/2016. Recommended annual screening, but bi-annual at a minimum. Patient agrees to repeat mammo in November  2. Tobacco use disorder - declines smoking cessation counseling  3. Hypercalcemia Lab Results  Component Value Date   CALCIUM 10.3 (H) 08/31/2017  - PTH, Intact and Calcium  4. Pulmonary emphysema, unspecified emphysema type [facility]) - personally reviewed CT chest  report 08/31/17. No pulmonary nodules. Emphysematous changes. - discussed treatment to include smoking cessation first line. She declines smoking cessation. - instructed on use of controller medication daily to prevent exacerbations. - Pneumovax given today - umeclidinium-vilanterol (ANORO ELLIPTA) 62.5-25 MCG/INH AEPB; Inhale 1 puff into the lungs daily.  Dispense: 60 each; Refill: 2 - Pneumococcal polysaccharide vaccine 23-valent greater than or equal to 2yo subcutaneous/IM  5. Screening for thyroid disorder, Family history of thyroid disease - TSH + free T4  6. Elevated cholesterol - Lipid Panel w/reflex Direct LDL  7. IUD (intrauterine device) in place   Patient education and anticipatory guidance given Patient agrees with treatment plan Follow-up in 3 months for COPD or sooner as needed if symptoms worsen or fail to improve  Levonne Hubert PA-C

## 2017-09-08 NOTE — Patient Instructions (Addendum)
- Anoro is your controller medication. You take this daily to prevent COPD exacerbations - Albuterol is your rescue inhaler. Use this as needed for shortness of breath and wheezing   Chronic Obstructive Pulmonary Disease Chronic obstructive pulmonary disease (COPD) is a long-term (chronic) lung problem. When you have COPD, it is hard for air to get in and out of your lungs. The way your lungs work will never return to normal. Usually the condition gets worse over time. There are things you can do to keep yourself as healthy as possible. Your doctor may treat your condition with:  Medicines.  Quitting smoking, if you smoke.  Rehabilitation. This may involve a team of specialists.  Oxygen.  Exercise and changes to your diet.  Lung surgery.  Comfort measures (palliative care).  Follow these instructions at home: Medicines  Take over-the-counter and prescription medicines only as told by your doctor.  Talk to your doctor before taking any cough or allergy medicines. You may need to avoid medicines that cause your lungs to be dry. Lifestyle  If you smoke, stop. Smoking makes the problem worse. If you need help quitting, ask your doctor.  Avoid being around things that make your breathing worse. This may include smoke, chemicals, and fumes.  Stay active, but remember to also rest.  Learn and use tips on how to relax.  Make sure you get enough sleep. Most adults need at least 7 hours a night.  Eat healthy foods. Eat smaller meals more often. Rest before meals. Controlled breathing  Learn and use tips on how to control your breathing as told by your doctor. Try: ? Breathing in (inhaling) through your nose for 1 second. Then, pucker your lips and breath out (exhale) through your lips for 2 seconds. ? Putting one hand on your belly (abdomen). Breathe in slowly through your nose for 1 second. Your hand on your belly should move out. Pucker your lips and breathe out slowly through  your lips. Your hand on your belly should move in as you breathe out. Controlled coughing  Learn and use controlled coughing to clear mucus from your lungs. The steps are: 1. Lean your head a little forward. 2. Breathe in deeply. 3. Try to hold your breath for 3 seconds. 4. Keep your mouth slightly open while coughing 2 times. 5. Spit any mucus out into a tissue. 6. Rest and do the steps again 1 or 2 times as needed. General instructions  Make sure you get all the shots (vaccines) that your doctor recommends. Ask your doctor about a flu shot and a pneumonia shot.  Use oxygen therapy and therapy to help improve your lungs (pulmonary rehabilitation) if told by your doctor. If you need home oxygen therapy, ask your doctor if you should buy a tool to measure your oxygen level (oximeter).  Make a COPD action plan with your doctor. This helps you know what to do if you feel worse than usual.  Manage any other conditions you have as told by your doctor.  Avoid going outside when it is very hot, cold, or humid.  Avoid people who have a sickness you can catch (contagious).  Keep all follow-up visits as told by your doctor. This is important. Contact a doctor if:  You cough up more mucus than usual.  There is a change in the color or thickness of the mucus.  It is harder to breathe than usual.  Your breathing is faster than usual.  You have trouble sleeping.  You need to use your medicines more often than usual.  You have trouble doing your normal activities such as getting dressed or walking around the house. Get help right away if:  You have shortness of breath while resting.  You have shortness of breath that stops you from: ? Being able to talk. ? Doing normal activities.  Your chest hurts for longer than 5 minutes.  Your skin color is more blue than usual.  Your pulse oximeter shows that you have low oxygen for longer than 5 minutes.  You have a fever.  You feel too  tired to breathe normally. Summary  Chronic obstructive pulmonary disease (COPD) is a long-term lung problem.  The way your lungs work will never return to normal. Usually the condition gets worse over time. There are things you can do to keep yourself as healthy as possible.  Take over-the-counter and prescription medicines only as told by your doctor.  If you smoke, stop. Smoking makes the problem worse. This information is not intended to replace advice given to you by your health care provider. Make sure you discuss any questions you have with your health care provider. Document Released: 01/04/2008 Document Revised: 12/24/2015 Document Reviewed: 03/14/2013 Elsevier Interactive Patient Education  2017 [employer].

## 2017-09-11 ENCOUNTER — Telehealth: Payer: Self-pay | Admitting: Physician Assistant

## 2017-09-11 DIAGNOSIS — J439 Emphysema, unspecified: Secondary | ICD-10-CM

## 2017-09-11 MED ORDER — ALBUTEROL SULFATE HFA 108 (90 BASE) MCG/ACT IN AERS: 1.0000 | INHALATION_SPRAY | Freq: Four times a day (QID) | RESPIRATORY_TRACT | 0 refills | Status: DC | PRN

## 2017-09-11 NOTE — Telephone Encounter (Signed)
Pt called. She did not get her refill on inhaler.

## 2017-09-11 NOTE — Telephone Encounter (Signed)
Attempted to contact Pt, no answer. Left VM with status update.

## 2017-09-11 NOTE — Telephone Encounter (Signed)
Left vm that med has been sent to pharmacy.

## 2017-09-11 NOTE — Telephone Encounter (Signed)
Medication sent. If you call patient to advise please document in chart.

## 2017-09-12 ENCOUNTER — Encounter: Payer: Self-pay | Admitting: Physician Assistant

## 2017-09-12 DIAGNOSIS — E7800 Pure hypercholesterolemia, unspecified: Secondary | ICD-10-CM | POA: Insufficient documentation

## 2017-09-12 DIAGNOSIS — J439 Emphysema, unspecified: Secondary | ICD-10-CM | POA: Insufficient documentation

## 2017-09-12 DIAGNOSIS — F17200 Nicotine dependence, unspecified, uncomplicated: Secondary | ICD-10-CM | POA: Insufficient documentation

## 2017-09-15 ENCOUNTER — Encounter: Payer: Self-pay | Admitting: Physician Assistant

## 2017-09-15 DIAGNOSIS — Z1329 Encounter for screening for other suspected endocrine disorder: Secondary | ICD-10-CM | POA: Diagnosis not present

## 2017-09-15 DIAGNOSIS — E7800 Pure hypercholesterolemia, unspecified: Secondary | ICD-10-CM | POA: Diagnosis not present

## 2017-09-18 LAB — PTH, INTACT AND CALCIUM
CALCIUM: 9.7 mg/dL (ref 8.6–10.2)
PTH: 36 pg/mL (ref 14–64)

## 2017-09-18 LAB — LIPID PANEL W/REFLEX DIRECT LDL
CHOL/HDL RATIO: 3.2 (calc) (ref <5.0)
Cholesterol: 219 mg/dL — ABNORMAL HIGH (ref <200)
HDL: 68 mg/dL (ref >50)
LDL Cholesterol (Calc): 137 mg/dL (calc) — ABNORMAL HIGH
Non-HDL Cholesterol (Calc): 151 mg/dL (calc) — ABNORMAL HIGH (ref <130)
TRIGLYCERIDES: 47 mg/dL (ref <150)

## 2017-09-18 LAB — TSH+FREE T4: TSH W/REFLEX TO FT4: 0.96 mIU/L

## 2017-09-18 NOTE — Progress Notes (Signed)
Recheck Calcium is in a normal range Thyroid and parathyroid are normal Cholesterol is borderline elevated. Recommend low-fat diet such as Mediterranean and regular aerobic exercise.

## 2017-10-30 ENCOUNTER — Other Ambulatory Visit: Payer: Self-pay | Admitting: Physician Assistant

## 2017-10-30 DIAGNOSIS — J439 Emphysema, unspecified: Secondary | ICD-10-CM

## 2017-12-08 ENCOUNTER — Encounter: Payer: Self-pay | Admitting: Physician Assistant

## 2017-12-08 ENCOUNTER — Ambulatory Visit: Payer: BLUE CROSS/BLUE SHIELD | Admitting: Physician Assistant

## 2017-12-08 ENCOUNTER — Ambulatory Visit (INDEPENDENT_AMBULATORY_CARE_PROVIDER_SITE_OTHER): Payer: BLUE CROSS/BLUE SHIELD

## 2017-12-08 VITALS — BP 118/76 | HR 74 | Wt 133.0 lb

## 2017-12-08 DIAGNOSIS — F17200 Nicotine dependence, unspecified, uncomplicated: Secondary | ICD-10-CM | POA: Diagnosis not present

## 2017-12-08 DIAGNOSIS — Q676 Pectus excavatum: Secondary | ICD-10-CM | POA: Diagnosis not present

## 2017-12-08 DIAGNOSIS — Z23 Encounter for immunization: Secondary | ICD-10-CM | POA: Diagnosis not present

## 2017-12-08 DIAGNOSIS — J439 Emphysema, unspecified: Secondary | ICD-10-CM

## 2017-12-08 DIAGNOSIS — J449 Chronic obstructive pulmonary disease, unspecified: Secondary | ICD-10-CM

## 2017-12-08 DIAGNOSIS — J300 Vasomotor rhinitis: Secondary | ICD-10-CM

## 2017-12-08 MED ORDER — IPRATROPIUM BROMIDE 0.06 % NA SOLN: 2.0000 | Freq: Four times a day (QID) | NASAL | 5 refills | Status: DC | PRN

## 2017-12-08 NOTE — Patient Instructions (Signed)

## 2017-12-08 NOTE — Progress Notes (Signed)
HPI:                                                                Raven Foster is a 47 y.o. female who presents to [facility]: Primary Care Sports Medicine today for COPD follow-up  This is a pleasant 47 yo F with PMH of pulmonary emphysema on chest CT, tobacco use, OAB, rhinitis. She has been using Anoro for about 3 weeks. Feels like her breathing and energy levels are improved. Feels like her left lung feels congested and occasionally coughs up a green-yellow mucous plug with black specks. No fever, chills, nightsweats, weight loss, or hemoptysis. Still smoking ~0.5 ppd (approx 15 packyears)    Depression screen [facility] 2/9 09/08/2017  Decreased Interest 0  Down, Depressed, Hopeless 0  PHQ - 2 Score 0    No flowsheet data found.    Past Medical History:  Diagnosis Date  . GERD (gastroesophageal reflux disease)   . IBS (irritable bowel syndrome)   . IUD (intrauterine device) in place 09/08/2017   Paraguard  . Kidney stone   . Lactose intolerance   . Pulmonary emphysema (HCC)    Past Surgical History:  Procedure Laterality Date  . HERNIA REPAIR    . INTRAUTERINE DEVICE (IUD) INSERTION    . SEPTOPLASTY    . TONSILLECTOMY     Social History   Tobacco Use  . Smoking status: Current Every Day Smoker    Packs/day: 0.50    Years: 30.00    Pack years: 15.00    Types: Cigarettes  . Smokeless tobacco: Current User  Substance Use Topics  . Alcohol use: Yes    Comment: 1-2 q wk   family history includes COPD in her mother; Hypertension in her father and mother; Thyroid disease in her sister.    ROS: negative except as noted in the HPI  Medications: Current Outpatient Medications  Medication Sig Dispense Refill  . PROAIR HFA 108 (90 Base) MCG/ACT inhaler INHALE 1-2 PUFFS INTO THE LUNGS EVERY 6 (SIX) HOURS AS NEEDED FOR WHEEZING OR SHORTNESS OF BREATH. 8.5 Inhaler 0  . umeclidinium-vilanterol (ANORO ELLIPTA) 62.5-25 MCG/INH AEPB Inhale 1 puff into  the lungs daily. 60 each 2   No current facility-administered medications for this visit.    No Known Allergies     Objective:  BP 118/76   Pulse 74   Wt 133 lb (60.3 kg)   SpO2 96%   BMI 23.56 kg/m  Gen:  alert, not ill-appearing, no distress, appropriate for age HEENT: head normocephalic without obvious abnormality, conjunctiva and cornea clear, nasal mucosa pink without rhinorrhea, oropharynx clear, moist mucous membranes, neck supple, there is mild left tonsillar adenopathy, trachea midline Pulm: Normal work of breathing, normal phonation, clear to auscultation bilaterally, no wheezes, rales or rhonchi CV: Normal rate, regular rhythm, s1 and s2 distinct, no murmurs, clicks or rubs  Neuro: alert and oriented x 3, no tremor MSK: extremities atraumatic, normal gait and station Skin: intact, no rashes on exposed skin, no jaundice, no cyanosis Psych: well-groomed, cooperative, good eye contact, euthymic mood, affect mood-congruent, speech is articulate, and thought processes clear and goal-directed  CLINICAL DATA:  One month history of cough.  EXAM: CT CHEST WITHOUT CONTRAST  TECHNIQUE: Multidetector CT imaging  of the chest was performed following the standard protocol without IV contrast.  COMPARISON:  Chest x-ray 08/31/2017  FINDINGS: Cardiovascular: The heart is normal in size. No pericardial effusion. Mild mass effect on the right ventricle due to a significant pectus deformity. The aorta is normal in caliber. No atherosclerotic calcifications. No coronary artery calcifications.  Mediastinum/Nodes: No mediastinal or hilar mass or adenopathy. The esophagus is grossly normal.  Lungs/Pleura: Hyperinflation and mild/early changes of emphysema. There are also areas of pulmonary scarring. No focal infiltrates, edema or effusions. No interstitial lung disease or bronchiectasis.  Upper Abdomen: No significant upper abdominal findings.  Musculoskeletal: No breast  masses, supraclavicular or axillary adenopathy.  The bony thorax is intact.  IMPRESSION: 1. Pectus deformity with mild mass effect on the right ventricle. 2. Mild/early emphysematous changes with hyperinflation and areas of pulmonary scarring. 3. No infiltrates, edema or effusions.  Emphysema (ICD10-J43.9).   Electronically Signed   By: Rudie Meyer M.D.   On: 08/31/2017 11:26  No results found for this or any previous visit (from the past 72 hour(s)). No results found.    Assessment and Plan: 47 y.o. female with   Pulmonary emphysema, unspecified emphysema type (HCC) - Plan: DG Chest 2 View  Tobacco use disorder  Chronic vasomotor rhinitis - Plan: ipratropium (ATROVENT) 0.06 % nasal spray  Need for Tdap vaccination - Plan: Tdap vaccine greater than or equal to 7yo IM  SpO2 96% on RA at rest, no tachypnea, no tachycardia, no adventitious lung sounds on exam.  CXR today for L-sided chest congestion Continue Anoro daily and Proair prn Continue Atrovent nasal spray for vasomotor rhinitis Declines smoking cessation today Pneumovax UTD Tdap given today   Patient education and anticipatory guidance given Patient agrees with treatment plan Follow-up in 6 months for COPD or sooner as needed if symptoms worsen or fail to improve  Levonne Hubert PA-C

## 2017-12-10 NOTE — Progress Notes (Signed)
Large lung volumes consistent with COPD No infiltrate or lung nodule

## 2017-12-17 ENCOUNTER — Encounter: Payer: Self-pay | Admitting: Physician Assistant

## 2018-01-03 ENCOUNTER — Ambulatory Visit (INDEPENDENT_AMBULATORY_CARE_PROVIDER_SITE_OTHER): Payer: BLUE CROSS/BLUE SHIELD

## 2018-01-03 ENCOUNTER — Ambulatory Visit: Payer: BLUE CROSS/BLUE SHIELD | Admitting: Sports Medicine

## 2018-01-03 ENCOUNTER — Encounter: Payer: Self-pay | Admitting: Sports Medicine

## 2018-01-03 DIAGNOSIS — M7989 Other specified soft tissue disorders: Secondary | ICD-10-CM | POA: Diagnosis not present

## 2018-01-03 DIAGNOSIS — M659 Synovitis and tenosynovitis, unspecified: Secondary | ICD-10-CM | POA: Diagnosis not present

## 2018-01-03 DIAGNOSIS — M79641 Pain in right hand: Secondary | ICD-10-CM

## 2018-01-03 DIAGNOSIS — M65949 Unspecified synovitis and tenosynovitis, unspecified hand: Secondary | ICD-10-CM

## 2018-01-03 MED ORDER — FLUCONAZOLE 150 MG PO TABS: 150.0000 mg | ORAL_TABLET | Freq: Once | ORAL | 0 refills | Status: AC

## 2018-01-03 MED ORDER — MELOXICAM 15 MG PO TABS
ORAL_TABLET | ORAL | 3 refills | Status: DC
Start: 2018-01-03 — End: 2019-03-18

## 2018-01-03 MED ORDER — DOXYCYCLINE HYCLATE 100 MG PO TABS: 100.0000 mg | ORAL_TABLET | Freq: Two times a day (BID) | ORAL | 0 refills | Status: AC

## 2018-01-03 NOTE — Assessment & Plan Note (Signed)
She did have a cut distally over the DIP, adding doxycycline for 7 days, Diflucan as she does get yeast infections. Adding meloxicam, full rheumatoid work-up. X-rays. Return to see me in 2 weeks.

## 2018-01-03 NOTE — Progress Notes (Signed)
Subjective:    I'm seeing this patient as a consultation for: Gena Fray, PA-C  CC: Right finger swelling  HPI: On Sunday this pleasant 47 year old female accidentally cut over the dorsum of her third DIP, over the next few days she developed swelling over the middle phalanx, and some pain at the PIP.  Pain is moderate, persistent, with only slight improvement over the past several days.  Minimal redness.  She does endorse significant morning stiffness, polyarthralgia, polymyalgia present for many years.  She also had an episode where she was bitten by multiple ticks not too long ago.  I reviewed the past medical history, family history, social history, surgical history, and allergies today and no changes were needed.  Please see the problem list section below in epic for further details.  Past Medical History: Past Medical History:  Diagnosis Date  . GERD (gastroesophageal reflux disease)   . IBS (irritable bowel syndrome)   . IUD (intrauterine device) in place 09/08/2017   Paraguard  . Kidney stone   . Lactose intolerance   . Pulmonary emphysema (HCC)    Past Surgical History: Past Surgical History:  Procedure Laterality Date  . HERNIA REPAIR    . INTRAUTERINE DEVICE (IUD) INSERTION    . SEPTOPLASTY    . TONSILLECTOMY     Social History: Social History   Socioeconomic History  . Marital status: Married    Spouse name: Not on file  . Number of children: Not on file  . Years of education: Not on file  . Highest education level: Not on file  Occupational History  . Not on file  Social Needs  . Financial resource strain: Not on file  . Food insecurity:    Worry: Not on file    Inability: Not on file  . Transportation needs:    Medical: Not on file    Non-medical: Not on file  Tobacco Use  . Smoking status: Current Every Day Smoker    Packs/day: 0.50    Years: 30.00    Pack years: 15.00    Types: Cigarettes  . Smokeless tobacco: Current User  Substance and  Sexual Activity  . Alcohol use: Yes    Comment: 1-2 q wk  . Drug use: No  . Sexual activity: Yes    Birth control/protection: IUD  Lifestyle  . Physical activity:    Days per week: Not on file    Minutes per session: Not on file  . Stress: Not on file  Relationships  . Social connections:    Talks on phone: Not on file    Gets together: Not on file    Attends religious service: Not on file    Active member of club or organization: Not on file    Attends meetings of clubs or organizations: Not on file    Relationship status: Not on file  Other Topics Concern  . Not on file  Social History Narrative  . Not on file   Family History: Family History  Problem Relation Age of Onset  . Hypertension Mother   . COPD Mother   . Hypertension Father   . Thyroid disease Sister    Allergies: No Known Allergies Medications: See med rec.  Review of Systems: No headache, visual changes, nausea, vomiting, diarrhea, constipation, dizziness, abdominal pain, skin rash, fevers, chills, night sweats, weight loss, swollen lymph nodes, body aches, joint swelling, muscle aches, chest pain, shortness of breath, mood changes, visual or auditory hallucinations.   Objective:  General: Well Developed, well nourished, and in no acute distress.  Neuro:  Extra-ocular muscles intact, able to move all 4 extremities, sensation grossly intact.  Deep tendon reflexes tested were normal. Psych: Alert and oriented, mood congruent with affect. ENT:  Ears and nose appear unremarkable.  Hearing grossly normal. Neck: Unremarkable overall appearance, trachea midline.  No visible thyroid enlargement. Eyes: Conjunctivae and lids appear unremarkable.  Pupils equal and round. Skin: Warm and dry, no rashes noted.  Cardiovascular: Pulses palpable, no extremity edema. Right hand: Overall unremarkable, with the exception of fusiform swelling at the third PIP, with tenderness over the middle phalanx, and a palpable synovitis  of the PIP.  No nail pitting.  Impression and Recommendations:   This case required medical decision making of moderate complexity.  Synovitis of right third PIP She did have a cut distally over the DIP, adding doxycycline for 7 days, Diflucan as she does get yeast infections. Adding meloxicam, full rheumatoid work-up. X-rays. Return to see me in 2 weeks. ___________________________________________ Ihor Austinhomas J. Benjamin Stainhekkekandam, M.D., ABFM., CAQSM. Primary Care and Sports Medicine  [facility]  Adjunct Instructor of Family Medicine  [employer]

## 2018-01-09 LAB — CBC WITH DIFFERENTIAL/PLATELET
Basophils Absolute: 46 cells/uL (ref 0–200)
Basophils Relative: 0.5 %
Eosinophils Absolute: 37 {cells}/uL (ref 15–500)
Eosinophils Relative: 0.4 %
HCT: 38.7 % (ref 35.0–45.0)
Hemoglobin: 13 g/dL (ref 11.7–15.5)
Lymphs Abs: 3211 {cells}/uL (ref 850–3900)
MCH: 28.8 pg (ref 27.0–33.0)
MCHC: 33.6 g/dL (ref 32.0–36.0)
MCV: 85.8 fL (ref 80.0–100.0)
MPV: 12.3 fL (ref 7.5–12.5)
Monocytes Relative: 5.7 %
Neutro Abs: 5382 cells/uL (ref 1500–7800)
Neutrophils Relative %: 58.5 %
Platelets: 261 10*3/uL (ref 140–400)
RBC: 4.51 Million/uL (ref 3.80–5.10)
RDW: 13.1 % (ref 11.0–15.0)
Total Lymphocyte: 34.9 %
WBC mixed population: 524 {cells}/uL (ref 200–950)
WBC: 9.2 Thousand/uL (ref 3.8–10.8)

## 2018-01-09 LAB — COMPREHENSIVE METABOLIC PANEL WITH GFR
AG Ratio: 2.1 (calc) (ref 1.0–2.5)
Albumin: 4.7 g/dL (ref 3.6–5.1)
CO2: 27 mmol/L (ref 20–32)
Calcium: 9.8 mg/dL (ref 8.6–10.2)
Glucose, Bld: 83 mg/dL (ref 65–139)
Potassium: 4.1 mmol/L (ref 3.5–5.3)
Sodium: 140 mmol/L (ref 135–146)
Total Bilirubin: 0.5 mg/dL (ref 0.2–1.2)

## 2018-01-09 LAB — LUPUS(12) PANEL
Anti Nuclear Antibody(ANA): POSITIVE — AB
C3 Complement: 111 mg/dL (ref 83–193)
C4 Complement: 31 mg/dL (ref 15–57)
ENA SM Ab Ser-aCnc: <1 AI
Rheumatoid fact SerPl-aCnc: <14 [IU]/mL (ref <14)
Ribosomal P Protein Ab: <1 AI
SM/RNP: <1 AI
SSA (Ro) (ENA) Antibody, IgG: <1 AI
SSB (La) (ENA) Antibody, IgG: <1 AI
Scleroderma (Scl-70) (ENA) Antibody, IgG: <1 AI
Thyroperoxidase Ab SerPl-aCnc: <1 IU/mL (ref <9)
ds DNA Ab: <1 [IU]/mL

## 2018-01-09 LAB — EHRLICHIA ANTIBODY PANEL
E. CHAFFEENSIS AB IGG: <1:64 {titer}
E. CHAFFEENSIS AB IGM: <1:20 {titer}

## 2018-01-09 LAB — COMPREHENSIVE METABOLIC PANEL
ALT: 13 U/L (ref 6–29)
AST: 14 U/L (ref 10–35)
Alkaline phosphatase (APISO): 61 U/L (ref 33–115)
BUN: 11 mg/dL (ref 7–25)
Chloride: 106 mmol/L (ref 98–110)
Creat: 0.65 mg/dL (ref 0.50–1.10)
Globulin: 2.2 g/dL (calc) (ref 1.9–3.7)
Total Protein: 6.9 g/dL (ref 6.1–8.1)

## 2018-01-09 LAB — B. BURGDORFI ANTIBODIES: B burgdorferi Ab IgG+IgM: <0.9 {index}

## 2018-01-09 LAB — BABESIA MICROTI ANTIBODIES IGG, IGM
Babesia microti IgG: <1:64 {titer}
Babesia microti IgM: <1:20 {titer}

## 2018-01-09 LAB — RHEUMATOID FACTOR (IGA, IGG, IGM)
Rheumatoid Factor (IgA): <5 U (ref <=6)
Rheumatoid Factor (IgG): <5 U (ref <=6)
Rheumatoid Factor (IgM): <5 U (ref <=6)

## 2018-01-09 LAB — CYCLIC CITRUL PEPTIDE ANTIBODY, IGG: Cyclic Citrullin Peptide Ab: <16 U

## 2018-01-09 LAB — ROCKY MTN SPOTTED FVR ABS PNL(IGG+IGM)
RMSF IgG: NOT DETECTED
RMSF IgM: NOT DETECTED

## 2018-01-09 LAB — SEDIMENTATION RATE: Sed Rate: 6 mm/h (ref 0–20)

## 2018-01-09 LAB — ANTI-NUCLEAR AB-TITER (ANA TITER): ANA Titer 1: 1:80 {titer} — ABNORMAL HIGH

## 2018-01-09 LAB — URIC ACID: Uric Acid, Serum: 3.3 mg/dL (ref 2.5–7.0)

## 2018-01-19 ENCOUNTER — Ambulatory Visit: Payer: BLUE CROSS/BLUE SHIELD | Admitting: Sports Medicine

## 2018-01-19 DIAGNOSIS — J300 Vasomotor rhinitis: Secondary | ICD-10-CM

## 2018-01-19 DIAGNOSIS — M659 Synovitis and tenosynovitis, unspecified: Secondary | ICD-10-CM

## 2018-01-19 DIAGNOSIS — M65949 Unspecified synovitis and tenosynovitis, unspecified hand: Secondary | ICD-10-CM

## 2018-01-19 MED ORDER — CALCIUM CARBONATE-VITAMIN D 600-400 MG-UNIT PO TABS: 1.0000 | ORAL_TABLET | Freq: Two times a day (BID) | ORAL | 11 refills | Status: AC

## 2018-01-19 MED ORDER — TRIAMCINOLONE ACETONIDE 55 MCG/ACT NA AERO: 2.0000 | INHALATION_SPRAY | Freq: Every day | NASAL | 11 refills | Status: DC

## 2018-01-19 NOTE — Progress Notes (Signed)
Subjective:    CC: Recheck finger pain  HPI: This is a very pleasant 47 year old female, we treated her for a synovitis of the PIP at the last visit, she did well with doxycycline, this all started when she accidentally cut her finger.  Overall symptoms have improved considerably.  She did not take any of the meloxicam.  Is also noting some pain at the right first MTP.  Preventive measures: Needs to start calcium and vitamin D supplement.  Runny nose: With cough, present chronically.  Not using any nasal steroids.  I reviewed the past medical history, family history, social history, surgical history, and allergies today and no changes were needed.  Please see the problem list section below in epic for further details.  Past Medical History: Past Medical History:  Diagnosis Date  . GERD (gastroesophageal reflux disease)   . IBS (irritable bowel syndrome)   . IUD (intrauterine device) in place 09/08/2017   Paraguard  . Kidney stone   . Lactose intolerance   . Pulmonary emphysema (HCC)    Past Surgical History: Past Surgical History:  Procedure Laterality Date  . HERNIA REPAIR    . INTRAUTERINE DEVICE (IUD) INSERTION    . SEPTOPLASTY    . TONSILLECTOMY     Social History: Social History   Socioeconomic History  . Marital status: Married    Spouse name: Not on file  . Number of children: Not on file  . Years of education: Not on file  . Highest education level: Not on file  Occupational History  . Not on file  Social Needs  . Financial resource strain: Not on file  . Food insecurity:    Worry: Not on file    Inability: Not on file  . Transportation needs:    Medical: Not on file    Non-medical: Not on file  Tobacco Use  . Smoking status: Current Every Day Smoker    Packs/day: 0.50    Years: 30.00    Pack years: 15.00    Types: Cigarettes  . Smokeless tobacco: Current User  Substance and Sexual Activity  . Alcohol use: Yes    Comment: 1-2 q wk  . Drug use: No    . Sexual activity: Yes    Birth control/protection: IUD  Lifestyle  . Physical activity:    Days per week: Not on file    Minutes per session: Not on file  . Stress: Not on file  Relationships  . Social connections:    Talks on phone: Not on file    Gets together: Not on file    Attends religious service: Not on file    Active member of club or organization: Not on file    Attends meetings of clubs or organizations: Not on file    Relationship status: Not on file  Other Topics Concern  . Not on file  Social History Narrative  . Not on file   Family History: Family History  Problem Relation Age of Onset  . Hypertension Mother   . COPD Mother   . Hypertension Father   . Thyroid disease Sister    Allergies: No Known Allergies Medications: See med rec.  Review of Systems: No fevers, chills, night sweats, weight loss, chest pain, or shortness of breath.   Objective:    General: Well Developed, well nourished, and in no acute distress.  Neuro: Alert and oriented x3, extra-ocular muscles intact, sensation grossly intact.  HEENT: Normocephalic, atraumatic, pupils equal round reactive to light,  neck supple, no masses, no lymphadenopathy, thyroid nonpalpable.  Skin: Warm and dry, no rashes. Cardiac: Regular rate and rhythm, no murmurs rubs or gallops, no lower extremity edema.  Respiratory: Clear to auscultation bilaterally. Not using accessory muscles, speaking in full sentences.  Impression and Recommendations:    Synovitis of right third PIP Negative rheumatoid work-up. Has not taken the meloxicam. She will try this, if no improvement in symptoms we will proceed with hand therapy.  Chronic vasomotor rhinitis Switching to Nasacort. Atrovent ineffective at this point. Mild cough, likely postnasal drip type syndrome.  I spent 25 minutes with this patient, greater than 50% was face-to-face time counseling regarding the above  diagnoses ___________________________________________ Ihor Austin. Benjamin Stain, M.D., ABFM., CAQSM. Primary Care and Sports Medicine [place] [facility] [facility]  Adjunct Instructor of Family Medicine  [employer]

## 2018-01-19 NOTE — Assessment & Plan Note (Signed)
Switching to Nasacort. Atrovent ineffective at this point. Mild cough, likely postnasal drip type syndrome.

## 2018-01-19 NOTE — Assessment & Plan Note (Signed)
Negative rheumatoid work-up. Has not taken the meloxicam. She will try this, if no improvement in symptoms we will proceed with hand therapy.

## 2018-02-26 ENCOUNTER — Other Ambulatory Visit: Payer: Self-pay | Admitting: Physician Assistant

## 2018-02-26 DIAGNOSIS — J439 Emphysema, unspecified: Secondary | ICD-10-CM

## 2018-06-15 ENCOUNTER — Ambulatory Visit: Payer: BLUE CROSS/BLUE SHIELD | Admitting: Physician Assistant

## 2018-06-15 ENCOUNTER — Encounter: Payer: Self-pay | Admitting: Physician Assistant

## 2018-06-15 VITALS — BP 119/82 | HR 91 | Wt 135.0 lb

## 2018-06-15 DIAGNOSIS — J310 Chronic rhinitis: Secondary | ICD-10-CM

## 2018-06-15 DIAGNOSIS — R1310 Dysphagia, unspecified: Secondary | ICD-10-CM

## 2018-06-15 DIAGNOSIS — Z720 Tobacco use: Secondary | ICD-10-CM | POA: Insufficient documentation

## 2018-06-15 DIAGNOSIS — F17200 Nicotine dependence, unspecified, uncomplicated: Secondary | ICD-10-CM

## 2018-06-15 DIAGNOSIS — R0989 Other specified symptoms and signs involving the circulatory and respiratory systems: Secondary | ICD-10-CM

## 2018-06-15 DIAGNOSIS — Z1231 Encounter for screening mammogram for malignant neoplasm of breast: Secondary | ICD-10-CM

## 2018-06-15 DIAGNOSIS — J439 Emphysema, unspecified: Secondary | ICD-10-CM

## 2018-06-15 DIAGNOSIS — R198 Other specified symptoms and signs involving the digestive system and abdomen: Secondary | ICD-10-CM

## 2018-06-15 MED ORDER — IPRATROPIUM BROMIDE 0.06 % NA SOLN: 1.0000 | Freq: Four times a day (QID) | NASAL | 4 refills | Status: DC | PRN

## 2018-06-15 MED ORDER — UMECLIDINIUM-VILANTEROL 62.5-25 MCG/INH IN AEPB: INHALATION_SPRAY | RESPIRATORY_TRACT | 3 refills | Status: DC

## 2018-06-15 NOTE — Patient Instructions (Addendum)
Postnasal Drip Postnasal drip is the feeling of mucus going down the back of your throat. Mucus is a slimy substance that moistens and cleans your nose and throat, as well as the air pockets in face bones near your forehead and cheeks (sinuses). Small amounts of mucus pass from your nose and sinuses down the back of your throat all the time. This is normal. When you produce too much mucus or the mucus gets too thick, you can feel it. Some common causes of postnasal drip include:  Having more mucus because of: ? A cold or the flu. ? Allergies. ? Cold air. ? Certain medicines.  Having more mucus that is thicker because of: ? A sinus or nasal infection. ? Dry air. ? A food allergy.  Follow these instructions at home: Relieving discomfort  Gargle with a salt-water mixture 3-4 times a day or as needed. To make a salt-water mixture, completely dissolve -1 tsp of salt in 1 cup of warm water.  If the air in your home is dry, use a humidifier to add moisture to the air.  Use a saline spray or container (neti pot) to flush out the nose (nasal irrigation). These methods can help clear away mucus and keep the nasal passages moist. General instructions  Take over-the-counter and prescription medicines only as told by your health care provider.  Follow instructions from your health care provider about eating or drinking restrictions. You may need to avoid caffeine.  Avoid things that you know you are allergic to (allergens), like dust, mold, pollen, pets, or certain foods.  Drink enough fluid to keep your urine pale yellow.  Keep all follow-up visits as told by your health care provider. This is important. Contact a health care provider if:  You have a fever.  You have a sore throat.  You have difficulty swallowing.  You have headache.  You have sinus pain.  You have a cough that does not go away.  The mucus from your nose becomes thick and is green or yellow in color.  You have  cold or flu symptoms that last more than 10 days. Summary  Postnasal drip is the feeling of mucus going down the back of your throat.  If your health care provider approves, use nasal irrigation or a nasal spray 2?4 times a day.  Avoid things that you know you are allergic to (allergens), like dust, mold, pollen, pets, or certain foods. This information is not intended to replace advice given to you by your health care provider. Make sure you discuss any questions you have with your health care provider. Document Released: 10/31/2016 Document Revised: 10/31/2016 Document Reviewed: 10/31/2016 Elsevier Interactive Patient Education  2018 [employer].   Steps to Quit Smoking Smoking tobacco can be harmful to your health and can affect almost every organ in your body. Smoking puts you, and those around you, at risk for developing many serious chronic diseases. Quitting smoking is difficult, but it is one of the best things that you can do for your health. It is never too late to quit. What are the benefits of quitting smoking? When you quit smoking, you lower your risk of developing serious diseases and conditions, such as:  Lung cancer or lung disease, such as COPD.  Heart disease.  Stroke.  Heart attack.  Infertility.  Osteoporosis and bone fractures.  Additionally, symptoms such as coughing, wheezing, and shortness of breath may get better when you quit. You may also find that you get sick  less often because your body is stronger at fighting off colds and infections. If you are pregnant, quitting smoking can help to reduce your chances of having a baby of low birth weight. How do I get ready to quit? When you decide to quit smoking, create a plan to make sure that you are successful. Before you quit:  Pick a date to quit. Set a date within the next two weeks to give you time to prepare.  Write down the reasons why you are quitting. Keep this list in places where you will see it  often, such as on your bathroom mirror or in your car or wallet.  Identify the people, places, things, and activities that make you want to smoke (triggers) and avoid them. Make sure to take these actions: ? Throw away all cigarettes at home, at work, and in your car. ? Throw away smoking accessories, such as Set designer. ? Clean your car and make sure to empty the ashtray. ? Clean your home, including curtains and carpets.  Tell your family, friends, and coworkers that you are quitting. Support from your loved ones can make quitting easier.  Talk with your health care provider about your options for quitting smoking.  Find out what treatment options are covered by your health insurance.  What strategies can I use to quit smoking? Talk with your healthcare provider about different strategies to quit smoking. Some strategies include:  Quitting smoking altogether instead of gradually lessening how much you smoke over a period of time. Research shows that quitting "cold [place]" is more successful than gradually quitting.  Attending in-person counseling to help you build problem-solving skills. You are more likely to have success in quitting if you attend several counseling sessions. Even short sessions of 10 minutes can be effective.  Finding resources and support systems that can help you to quit smoking and remain smoke-free after you quit. These resources are most helpful when you use them often. They can include: ? Online chats with a Veterinary surgeon. ? Telephone quitlines. ? Automotive engineer. ? Support groups or group counseling. ? Text messaging programs. ? Mobile phone applications.  Taking medicines to help you quit smoking. (If you are pregnant or breastfeeding, talk with your health care provider first.) Some medicines contain nicotine and some do not. Both types of medicines help with cravings, but the medicines that include nicotine help to relieve withdrawal  symptoms. Your health care provider may recommend: ? Nicotine patches, gum, or lozenges. ? Nicotine inhalers or sprays. ? Non-nicotine medicine that is taken by mouth.  Talk with your health care provider about combining strategies, such as taking medicines while you are also receiving in-person counseling. Using these two strategies together makes you more likely to succeed in quitting than if you used either strategy on its own. If you are pregnant or breastfeeding, talk with your health care provider about finding counseling or other support strategies to quit smoking. Do not take medicine to help you quit smoking unless told to do so by your health care provider. What things can I do to make it easier to quit? Quitting smoking might feel overwhelming at first, but there is a lot that you can do to make it easier. Take these important actions:  Reach out to your family and friends and ask that they support and encourage you during this time. Call telephone quitlines, reach out to support groups, or work with a counselor for support.  Ask people who smoke to  avoid smoking around you.  Avoid places that trigger you to smoke, such as bars, parties, or smoke-break areas at work.  Spend time around people who do not smoke.  Lessen stress in your life, because stress can be a smoking trigger for some people. To lessen stress, try: ? Exercising regularly. ? Deep-breathing exercises. ? Yoga. ? Meditating. ? Performing a body scan. This involves closing your eyes, scanning your body from head to toe, and noticing which parts of your body are particularly tense. Purposefully relax the muscles in those areas.  Download or purchase mobile phone or tablet apps (applications) that can help you stick to your quit plan by providing reminders, tips, and encouragement. There are many free apps, such as QuitGuide from the [employer] developer(Centers for Disease Control and Prevention). You can find other support for  quitting smoking (smoking cessation) through smokefree.gov and other websites.  How will I feel when I quit smoking? Within the first 24 hours of quitting smoking, you may start to feel some withdrawal symptoms. These symptoms are usually most noticeable 2-3 days after quitting, but they usually do not last beyond 2-3 weeks. Changes or symptoms that you might experience include:  Mood swings.  Restlessness, anxiety, or irritation.  Difficulty concentrating.  Dizziness.  Strong cravings for sugary foods in addition to nicotine.  Mild weight gain.  Constipation.  Nausea.  Coughing or a sore throat.  Changes in how your medicines work in your body.  A depressed mood.  Difficulty sleeping (insomnia).  After the first 2-3 weeks of quitting, you may start to notice more positive results, such as:  Improved sense of smell and taste.  Decreased coughing and sore throat.  Slower heart rate.  Lower blood pressure.  Clearer skin.  The ability to breathe more easily.  Fewer sick days.  Quitting smoking is very challenging for most people. Do not get discouraged if you are not successful the first time. Some people need to make many attempts to quit before they achieve long-term success. Do your best to stick to your quit plan, and talk with your health care provider if you have any questions or concerns. This information is not intended to replace advice given to you by your health care provider. Make sure you discuss any questions you have with your health care provider. Document Released: 07/12/2001 Document Revised: 03/15/2016 Document Reviewed: 12/02/2014 Elsevier Interactive Patient Education  [employer]2018 Elsevier Inc.

## 2018-06-15 NOTE — Progress Notes (Signed)
HPI:                                                                Raven Foster is a 47 y.o. female [facility] today for COPD follow-up  COPD: pulmonary emphysema on chest CT 08/31/17. She had been seen for multiple respiratory infections in urgent care. She has been using Anoro for about 6 months now. Feels like her breathing and energy levels are improved.  No fever, chills, nightsweats, weight loss, or hemoptysis. Still smoking ~10 cigarettes/day (approx 15 packyears), smokes first cigarette at 6 am.   Declines smoking cessation at this time. CAT Score 06/15/2018  Total CAT Score 5   She is still bothered by rhinitis and mucus in her throat. Reports difficulty swallowing her own saliva at times due to globus sensation and mucus. She had been taking Ipratropium, but states this dried her out severely. She currently uses Nasacort prn. She has been worked up for allergies and was told this is chronic vasomotor rhinitis. Antihistamines have not been helpful to her in the past.    Past Medical History:  Diagnosis Date  . GERD (gastroesophageal reflux disease)   . IBS (irritable bowel syndrome)   . IUD (intrauterine device) in place 09/08/2017   Paraguard  . Kidney stone   . Lactose intolerance   . Pulmonary emphysema (HCC)    Past Surgical History:  Procedure Laterality Date  . HERNIA REPAIR    . INTRAUTERINE DEVICE (IUD) INSERTION    . SEPTOPLASTY    . TONSILLECTOMY     Social History   Tobacco Use  . Smoking status: Current Every Day Smoker    Packs/day: 0.50    Years: 30.00    Pack years: 15.00    Types: Cigarettes  . Smokeless tobacco: Current User  Substance Use Topics  . Alcohol use: Yes    Comment: 1-2 q wk   family history includes COPD in her mother; Hypertension in her father and mother; Thyroid disease in her sister.    ROS: negative except as noted in the HPI  Medications: Current  Outpatient Medications  Medication Sig Dispense Refill  . Calcium Carbonate-Vitamin D 600-400 MG-UNIT tablet Take 1 tablet by mouth 2 (two) times daily. 60 tablet 11  . ipratropium (ATROVENT) 0.06 % nasal spray Place 1-2 sprays into both nostrils 4 (four) times daily as needed. 15 mL 4  . meloxicam (MOBIC) 15 MG tablet One tab PO qAM with breakfast for 2 weeks, then daily prn pain. 30 tablet 3  . PROAIR HFA 108 (90 Base) MCG/ACT inhaler INHALE 1-2 PUFFS INTO THE LUNGS EVERY 6 (SIX) HOURS AS NEEDED FOR WHEEZING OR SHORTNESS OF BREATH. 8.5 Inhaler 0  . triamcinolone (NASACORT) 55 MCG/ACT AERO nasal inhaler Place 2 sprays into the nose daily. 1 Inhaler 11  . umeclidinium-vilanterol (ANORO ELLIPTA) 62.5-25 MCG/INH AEPB TAKE 1 PUFF BY MOUTH EVERY DAY 90 each 3   No current facility-administered medications for this visit.    No Known Allergies     Objective:  BP 119/82   Pulse 91   Wt 135 lb (61.2 kg)   SpO2 99%   BMI 23.91 kg/m  Gen:  alert, not ill-appearing, no distress,  appropriate for age HEENT: head normocephalic without obvious abnormality, conjunctiva and cornea clear, septum slightly deviated, nasal mucosa pink, oropharynx clear, tonsils absent, neck supple, she endorses tenderness in her tonsillar nodes without palpable adenopathy, no cervical adenopathy, trachea midline Pulm: Normal work of breathing, normal phonation, clear to auscultation bilaterally, no wheezes, rales or rhonchi CV: Normal rate, regular rhythm, s1 and s2 distinct, no murmurs, clicks or rubs  Neuro: alert and oriented x 3, no tremor MSK: extremities atraumatic, normal gait and station Skin: intact, no rashes on exposed skin, no jaundice, no cyanosis Psych: well-groomed, cooperative, good eye contact, euthymic mood, affect mood-congruent, speech is articulate, and thought processes clear and goal-directed    No results found for this or any previous visit (from the past 72 hour(s)). No results  found.    Assessment and Plan: 47 y.o. female with   .Raven Foster was seen today for follow-up.  Diagnoses and all orders for this visit:  Pulmonary emphysema, unspecified emphysema type (HCC) Comments: dx on CT chest 08/2017. No spirometry. Declines smoking cessation. Started on Anoro 09/08/17 Orders: -     umeclidinium-vilanterol (ANORO ELLIPTA) 62.5-25 MCG/INH AEPB; TAKE 1 PUFF BY MOUTH EVERY DAY  Chronic rhinitis -     Ambulatory referral to ENT -     ipratropium (ATROVENT) 0.06 % nasal spray; Place 1-2 sprays into both nostrils 4 (four) times daily as needed.  Pulmonary emphysema, unspecified emphysema type (HCC) -     umeclidinium-vilanterol (ANORO ELLIPTA) 62.5-25 MCG/INH AEPB; TAKE 1 PUFF BY MOUTH EVERY DAY  Globus sensation -     Ambulatory referral to ENT  Dysphagia, unspecified type -     Ambulatory referral to ENT  Tobacco use disorder  Declined smoking cessation  Breast cancer screening by mammogram -     MM 3D SCREEN BREAST BILATERAL; Future   COPD Well controlled on Anoro. CAT score 5 No exacerbations in the last 6 months Declines smoking cessation Pneumococcal vaccine UTD Declines influenza vaccine Recommend f/u PFT's in 3 months  Chronic rhinitis She had some dryness with Ipratropium. Instructed her to re-start this and dial back her dosing if she has excessive dryness. Recommend 1-2 sprays each nare 2-4 times per day as needed Due to her smoking hx and globus sensation with dysphagia, referring to ENT for further eval  Patient education and anticipatory guidance given Patient agrees with treatment plan Follow-up as needed if symptoms worsen or fail to improve  Levonne Hubert PA-C

## 2018-07-04 DIAGNOSIS — J310 Chronic rhinitis: Secondary | ICD-10-CM | POA: Diagnosis not present

## 2018-07-04 DIAGNOSIS — R1310 Dysphagia, unspecified: Secondary | ICD-10-CM | POA: Diagnosis not present

## 2018-07-04 DIAGNOSIS — R0989 Other specified symptoms and signs involving the circulatory and respiratory systems: Secondary | ICD-10-CM | POA: Diagnosis not present

## 2018-07-04 DIAGNOSIS — R6889 Other general symptoms and signs: Secondary | ICD-10-CM | POA: Diagnosis not present

## 2018-08-24 DIAGNOSIS — N760 Acute vaginitis: Secondary | ICD-10-CM | POA: Diagnosis not present

## 2018-09-21 ENCOUNTER — Other Ambulatory Visit: Payer: BLUE CROSS/BLUE SHIELD

## 2018-09-27 IMAGING — DX DG CHEST 2V
2 series · 2 of 2 positions shown · non-contrast
Comparison: Nonea

CLINICAL DATA: Cough for 1 month, onset of lip swelling last night,
smoker, history GERD and irritable bowel syndrome

EXAM:
CHEST  2 VIEW

[chest pa]
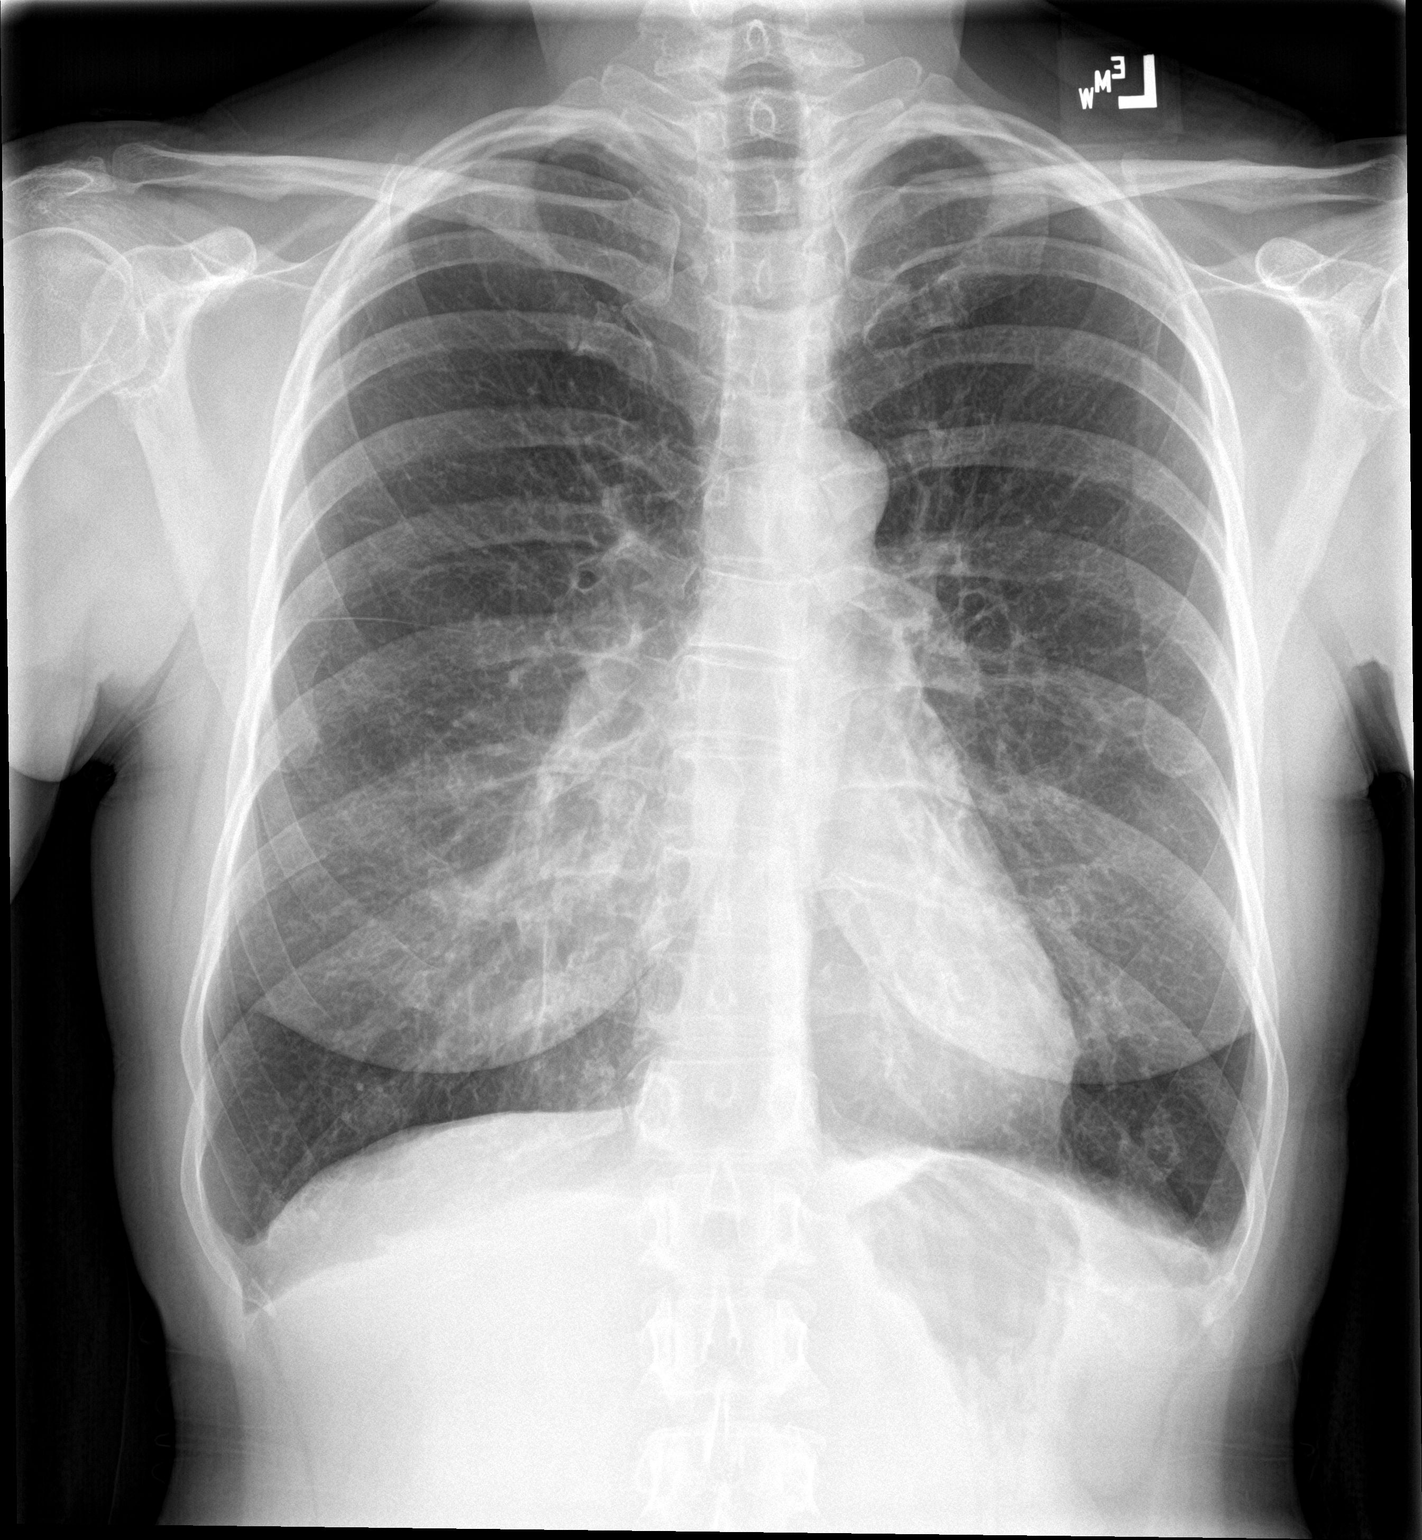

[chest lat]
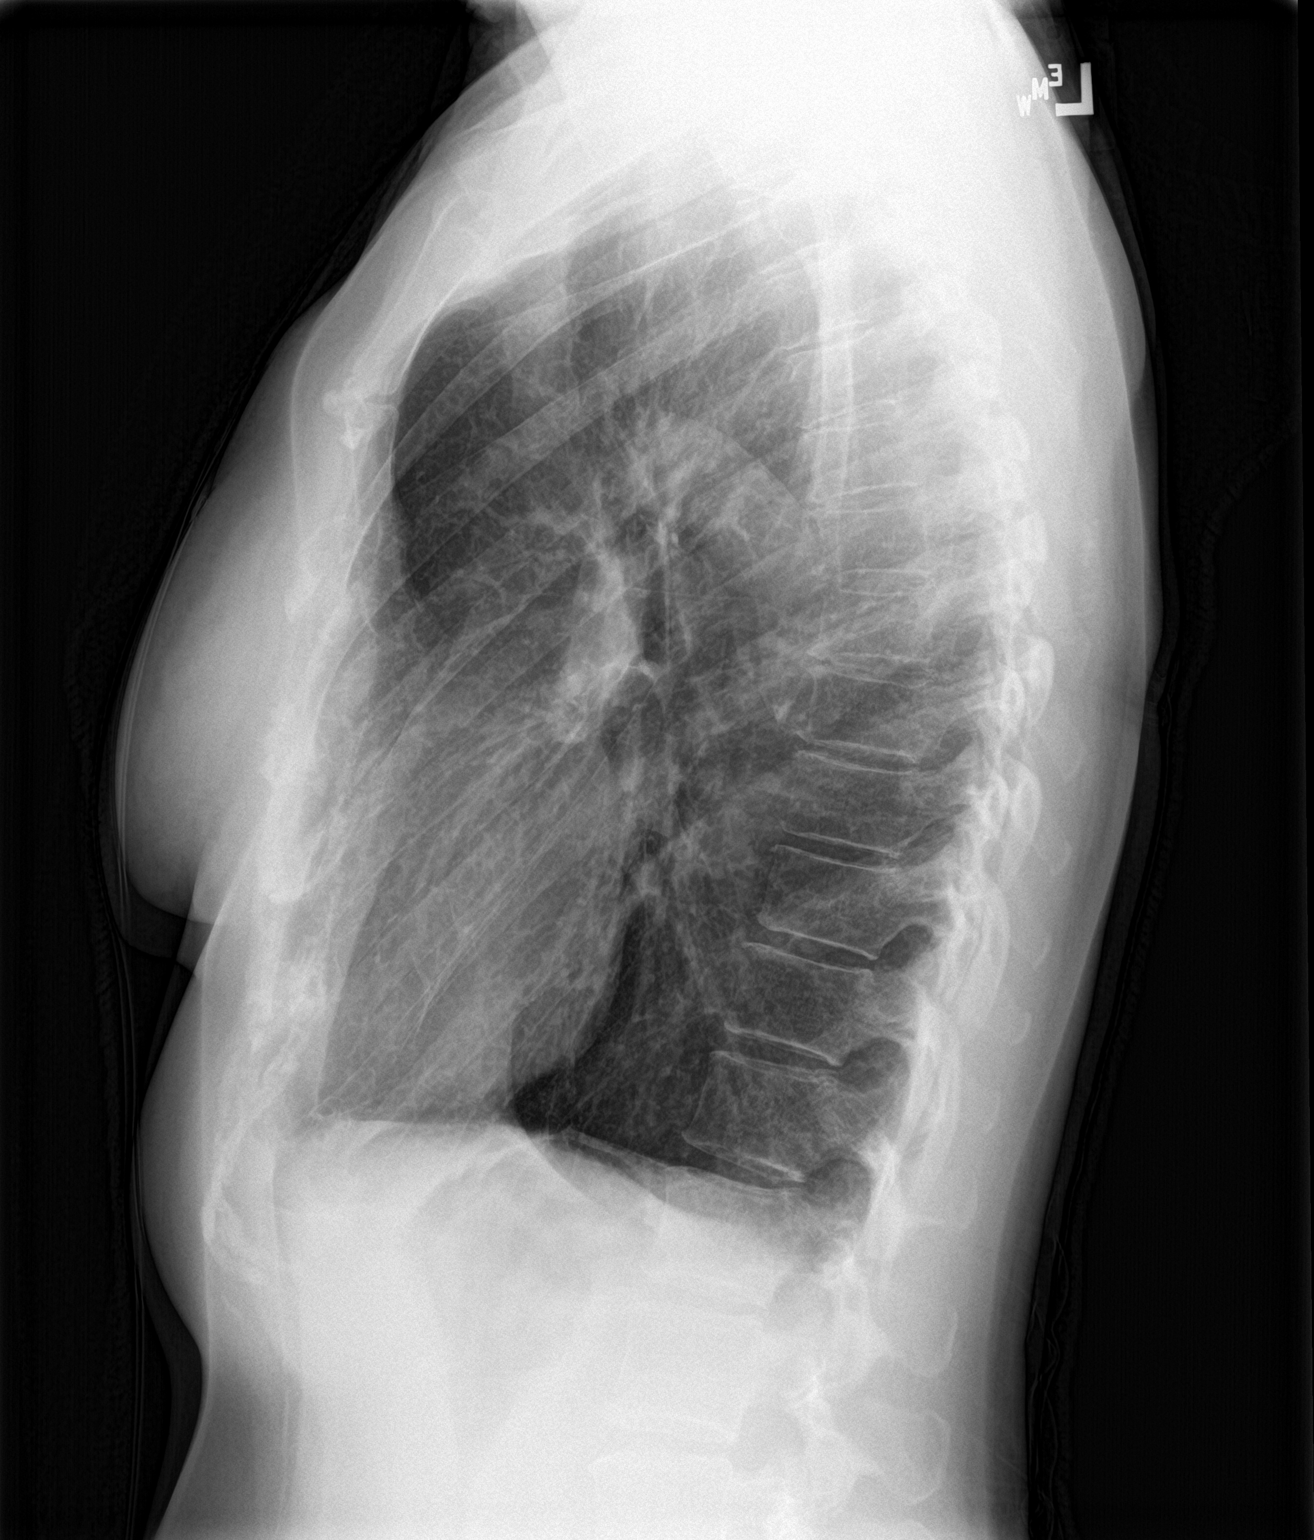

[2 of 2 positions shown; findings below may reference images not displayed]

FINDINGS: Normal heart size and pulmonary vascularity.

Emphysematous and bronchitic changes consistent with COPD.

Abnormal density at RIGHT hilum and RIGHT perihilar region, could
potentially be related to a pectus deformity though RIGHT hilar
mass/adenopathy not excluded.

No segmental consolidation, pleural effusion or pneumothorax.

No additional osseous abnormalities.
IMPRESSION: COPD changes with abnormal density at the RIGHT hilum and perihilar
region which could be related to hilar mass/adenopathy though this
could potentially reflect an artifact related to pectus deformity;
CT chest with contrast recommended to assess for potential neoplasm.

These results will be called to the ordering clinician or
representative by the Radiologist Assistant, and communication
documented in the PACS or zVision Dashboard.

## 2019-01-30 IMAGING — DX DG HAND COMPLETE 3+V*R*
3 series · 3 of 3 positions shown · non-contrast
Comparison: None.

CLINICAL DATA: RIGHT hand pain and swelling. Middle finger PIP
swelling.

EXAM:
RIGHT HAND - COMPLETE 3+ VIEW

[hand pa]
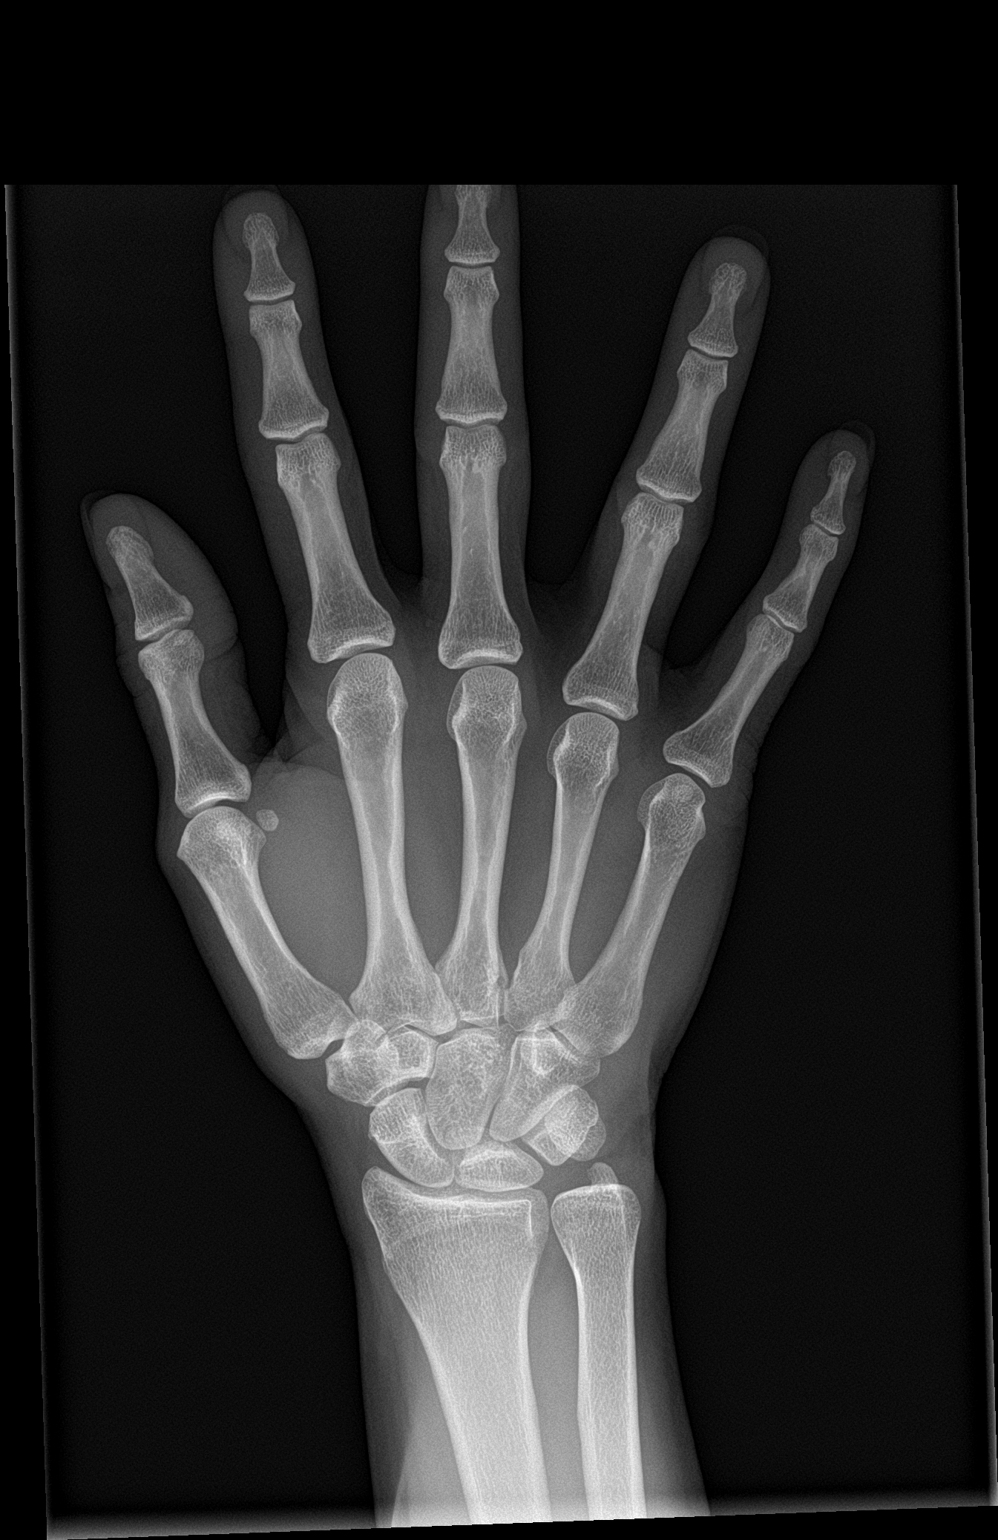

[hand obl]
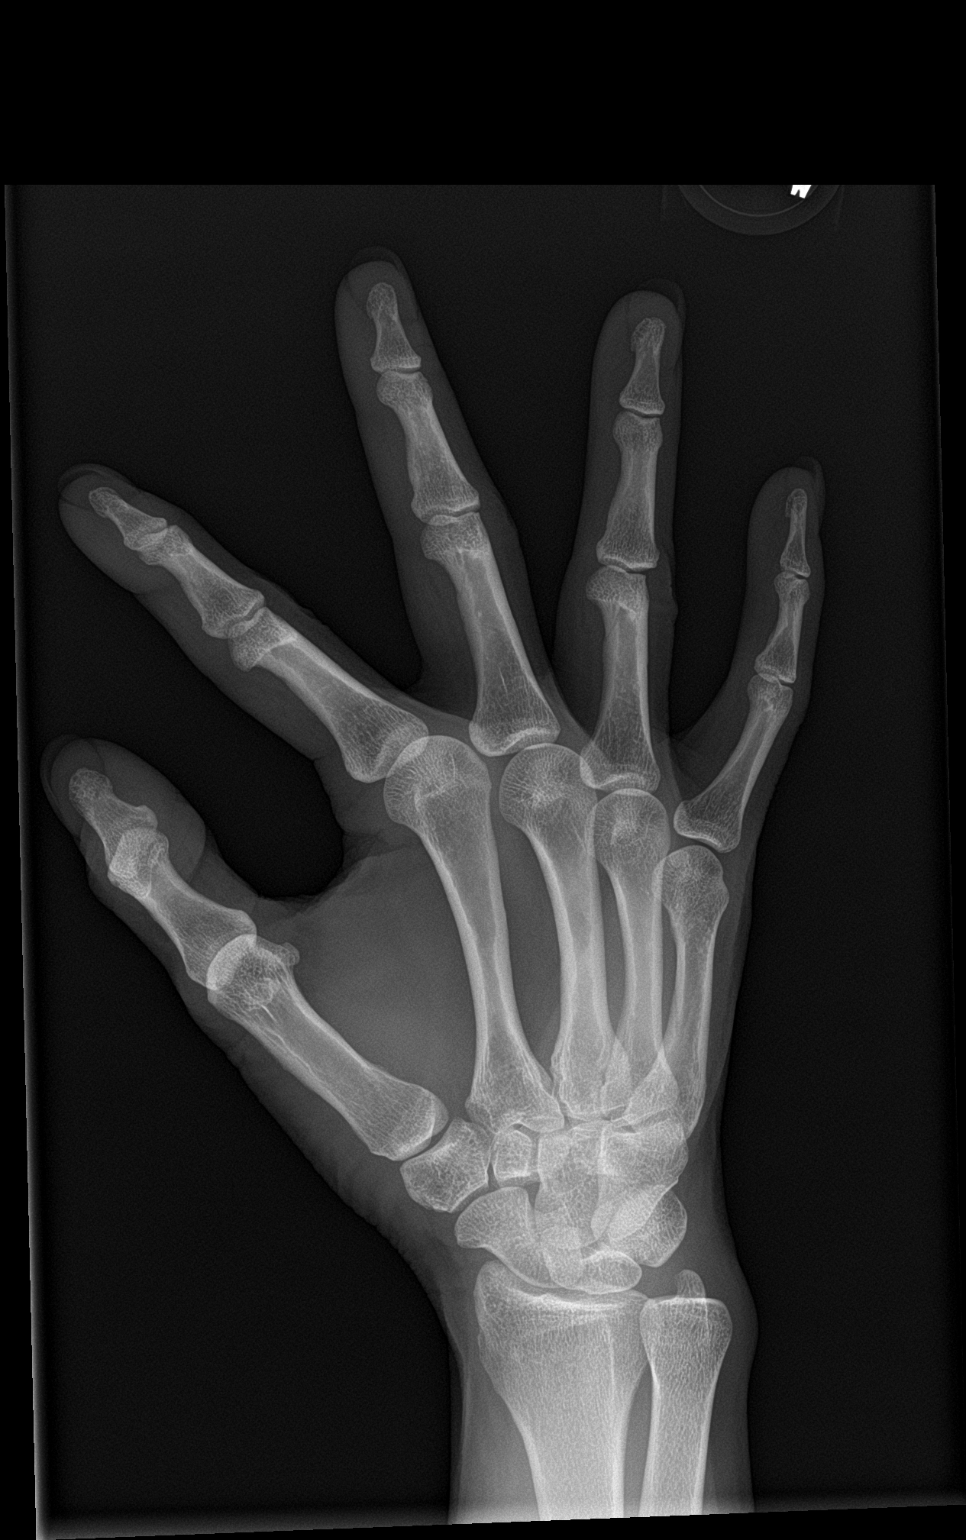

[hand lat]
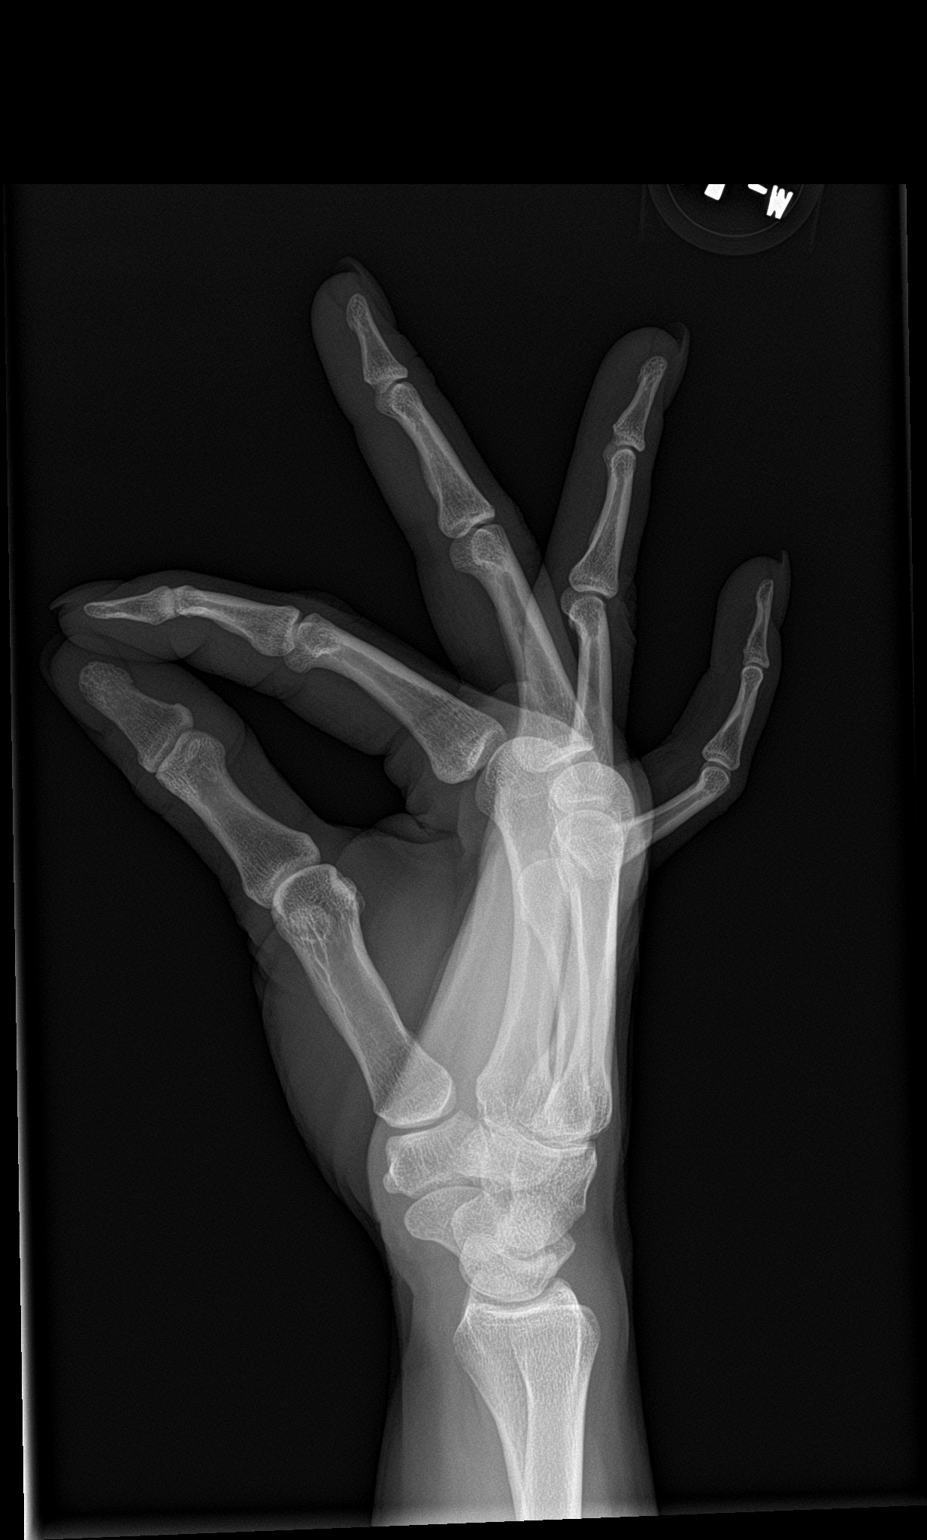

[3 of 3 positions shown; findings below may reference images not displayed]

FINDINGS: There is no evidence of fracture or dislocation. There is no
evidence of arthropathy or other focal bone abnormality. Soft
tissues are unremarkable.
IMPRESSION: Negative.

## 2019-03-18 ENCOUNTER — Ambulatory Visit (INDEPENDENT_AMBULATORY_CARE_PROVIDER_SITE_OTHER): Payer: Managed Care, Other (non HMO)

## 2019-03-18 ENCOUNTER — Other Ambulatory Visit: Payer: Self-pay

## 2019-03-18 ENCOUNTER — Encounter: Payer: Self-pay | Admitting: Physician Assistant

## 2019-03-18 ENCOUNTER — Ambulatory Visit: Payer: Managed Care, Other (non HMO) | Admitting: Physician Assistant

## 2019-03-18 VITALS — BP 115/76 | HR 74 | Temp 98.2°F | Wt 132.0 lb

## 2019-03-18 DIAGNOSIS — R41840 Attention and concentration deficit: Secondary | ICD-10-CM

## 2019-03-18 DIAGNOSIS — J439 Emphysema, unspecified: Secondary | ICD-10-CM

## 2019-03-18 DIAGNOSIS — F419 Anxiety disorder, unspecified: Secondary | ICD-10-CM | POA: Diagnosis not present

## 2019-03-18 DIAGNOSIS — R0781 Pleurodynia: Secondary | ICD-10-CM

## 2019-03-18 DIAGNOSIS — J441 Chronic obstructive pulmonary disease with (acute) exacerbation: Secondary | ICD-10-CM

## 2019-03-18 MED ORDER — ALBUTEROL SULFATE HFA 108 (90 BASE) MCG/ACT IN AERS: 1.0000 | INHALATION_SPRAY | RESPIRATORY_TRACT | 3 refills | Status: DC | PRN

## 2019-03-18 MED ORDER — ATOMOXETINE HCL 10 MG PO CAPS: ORAL_CAPSULE | ORAL | 2 refills | Status: DC

## 2019-03-18 MED ORDER — ANORO ELLIPTA 62.5-25 MCG/INH IN AEPB: 1.0000 | INHALATION_SPRAY | Freq: Every day | RESPIRATORY_TRACT | 5 refills | Status: DC

## 2019-03-18 MED ORDER — MELOXICAM 15 MG PO TABS: ORAL_TABLET | ORAL | 1 refills | Status: DC

## 2019-03-18 NOTE — Patient Instructions (Signed)
Atomoxetine capsules What is this medicine? ATOMOXETINE (AT oh mox e teen) is used to treat attention deficit/hyperactivity disorder, also known as ADHD. It is not a stimulant like other drugs for ADHD. This drug can improve attention span, concentration, and emotional control. It can also reduce restless or overactive behavior. This medicine may be used for other purposes; ask your health care provider or pharmacist if you have questions. COMMON BRAND NAME(S): Strattera What should I tell my health care provider before I take this medicine? They need to know if you have any of these conditions:  glaucoma  high or low blood pressure  history of stroke  irregular heartbeat or other cardiac disease  liver disease  mania or bipolar disorder  pheochromocytoma  suicidal thoughts  an unusual or allergic reaction to atomoxetine, other medicines, foods, dyes, or preservatives  pregnant or trying to get pregnant  breast-feeding How should I use this medicine? Take this medicine by mouth with a glass of water. Follow the directions on the prescription label. You can take it with or without food. If it upsets your stomach, take it with food. If you have difficulty sleeping and you take more than 1 dose per day, take your last dose before 6 PM. Take your medicine at regular intervals. Do not take it more often than directed. Do not stop taking except on your doctor's advice. A special MedGuide will be given to you by the pharmacist with each prescription and refill. Be sure to read this information carefully each time. Talk to your pediatrician regarding the use of this medicine in children. While this drug may be prescribed for children as young as 6 years for selected conditions, precautions do apply. Overdosage: If you think you have taken too much of this medicine contact a poison control center or emergency room at once. NOTE: This medicine is only for you. Do not share this medicine with  others. What if I miss a dose? If you miss a dose, take it as soon as you can. If it is almost time for your next dose, take only that dose. Do not take double or extra doses. What may interact with this medicine? Do not take this medicine with any of the following medications:  cisapride  dronedarone  MAOIs like Carbex, Eldepryl, Marplan, Nardil, and Parnate  pimozide  reboxetine  thioridazine This medicine may also interact with the following medications:  certain medicines for blood pressure, heart disease, irregular heart beat  certain medicines for depression, anxiety, or psychotic disturbances  certain medicines for lung disease like albuterol  cold or allergy medicines  dofetilide  fluoxetine  medicines that increase blood pressure like dopamine, dobutamine, or ephedrine  other medicines that prolong the QT interval (cause an abnormal heart rhythm)  paroxetine  quinidine  stimulant medicines for attention disorders, weight loss, or to stay awake  ziprasidone This list may not describe all possible interactions. Give your health care provider a list of all the medicines, herbs, non-prescription drugs, or dietary supplements you use. Also tell them if you smoke, drink alcohol, or use illegal drugs. Some items may interact with your medicine. What should I watch for while using this medicine? It may take a week or more for this medicine to take effect. This is why it is very important to continue taking the medicine and not miss any doses. If you have been taking this medicine regularly for some time, do not suddenly stop taking it. Ask your doctor or health care   professional for advice. Rarely, this medicine may increase thoughts of suicide or suicide attempts in children and teenagers. Call your child's health care professional right away if your child or teenager has new or increased thoughts of suicide or has changes in mood or behavior like becoming irritable or  anxious. Regularly monitor your child for these behavioral changes. For males, contact you doctor or health care professional right away if you have an erection that lasts longer than 4 hours or if it becomes painful. This may be a sign of serious problem and must be treated right away to prevent permanent damage. You may get drowsy or dizzy. Do not drive, use machinery, or do anything that needs mental alertness until you know how this medicine affects you. Do not stand or sit up quickly, especially if you are an older patient. This reduces the risk of dizzy or fainting spells. Alcohol can make you more drowsy and dizzy. Avoid alcoholic drinks. Do not treat yourself for coughs, colds or allergies without asking your doctor or health care professional for advice. Some ingredients can increase possible side effects. Your mouth may get dry. Chewing sugarless gum or sucking hard candy, and drinking plenty of water will help. What side effects may I notice from receiving this medicine? Side effects that you should report to your doctor or health care professional as soon as possible:  allergic reactions like skin rash, itching or hives, swelling of the face, lips, or tongue  breathing problems  chest pain  dark urine  fast, irregular heartbeat  general ill feeling or flu-like symptoms  high blood pressure  males: prolonged or painful erection  stomach pain or tenderness  trouble passing urine or change in the amount of urine  vomiting  weight loss  yellowing of the eyes or skin Side effects that usually do not require medical attention (report to your doctor or health care professional if they continue or are bothersome):  change in sex drive or performance  constipation or diarrhea  headache  loss of appetite  menstrual period irregularities  nausea  stomach upset This list may not describe all possible side effects. Call your doctor for medical advice about side effects.  You may report side effects to FDA at 1-800-FDA-1088. Where should I keep my medicine? Keep out of the reach of children. Store at room temperature between 15 and 30 degrees C (59 and 86 degrees F). Throw away any unused medication after the expiration date. NOTE: This sheet is a summary. It may not cover all possible information. If you have questions about this medicine, talk to your doctor, pharmacist, or health care provider.  2020 Elsevier/Gold Standard (2018-06-20 15:02:07)  

## 2019-03-18 NOTE — Progress Notes (Signed)
HPI:                                                                Raven Foster is a 48 y.o. female who [facility]: Primary Care Sports Medicine today for right rib Foster  48 yo F with PMH of emphysema and tobacco use presents with 1 month of productive cough, wheezing and right-sided rib Foster since July. She had a productive cough that has been gradually improving. Foster began in the right posterior ribs and has migrated to the anterior ribs just below the breast and radiates into her right shoulder blade She had a negative COVID-19 test on 03/07/19 She has been treated aggressively with 7 days of Doxycycline followed by Azithromycin 500 mg x 3 days with Prednisone 40 mg x 3 days. Her dentist then gave her a 5 day Zpak and took her last dose today and he gave her a Medrol dosepak, which she is currently taking. She states she was smoking 15 cigarettes per day and has reduced to 4 cigarettes per day.  She also is requesting to start medication to help with symptoms of anxiety and what she considers ADHD. Has never been tested for ADHD, but feels easily distracted most of the time for many years. She reports feeling nervous and anxious most days and attributes this to the reason she continues to smoke. Denies depressed mood, sleep disturbance, or SI.  [facility] 04/05/2019 03/18/2019 09/08/2017  Decreased Interest 0 0 0  Down, Depressed, Hopeless 0 0 0  PHQ - 2 Score 0 0 0   GAD 7 : Generalized Anxiety Score 03/18/2019  Nervous, Anxious, on Edge 3  Control/stop worrying 0  Worry too much - different things 0  Trouble relaxing 0  Restless 0  Easily annoyed or irritable 0  Afraid - awful might happen 0  Total GAD 7 Score 3  Anxiety Difficulty Somewhat difficult      Past Medical History:  Diagnosis Date  . GERD (gastroesophageal reflux disease)   . IBS (irritable bowel syndrome)   . IUD (intrauterine device) in place 09/08/2017   Paraguard   . Kidney stone   . Lactose intolerance   . Pulmonary emphysema (HCC)    Past Surgical History:  Procedure Laterality Date  . HERNIA REPAIR    . INTRAUTERINE DEVICE (IUD) INSERTION    . SEPTOPLASTY    . TONSILLECTOMY     Social History   Tobacco Use  . Smoking status: Current Every Day Smoker    Packs/day: 0.50    Years: 30.00    Pack years: 15.00    Types: Cigarettes  . Smokeless tobacco: Current User  Substance Use Topics  . Alcohol use: Yes    Comment: 1-2 q wk   family history includes COPD in her mother; Hypertension in her father and mother; Thyroid disease in her sister.    ROS: negative except as noted in the HPI  Medications: Current Outpatient Medications  Medication Sig Dispense Refill  . albuterol (PROAIR HFA) 108 (90 Base) MCG/ACT inhaler Inhale 1-2 puffs into the lungs every 4 (four) hours as needed for wheezing or shortness of breath. 6.7 g 3  . Calcium Carbonate-Vitamin D 600-400 MG-UNIT tablet Take 1  tablet by mouth 2 (two) times daily. 60 tablet 11  . meloxicam (MOBIC) 15 MG tablet One tab PO qAM with breakfast for 2 weeks, then daily prn Foster. 30 tablet 1  . omeprazole (PRILOSEC) 40 MG capsule Take 40 mg by mouth daily.    Marland Kitchen umeclidinium-vilanterol (ANORO ELLIPTA) 62.5-25 MCG/INH AEPB Inhale 1 puff into the lungs daily. TAKE 1 PUFF BY MOUTH EVERY DAY 60 each 5  . atomoxetine (STRATTERA) 10 MG capsule Take 1 capsule (10 mg total) by mouth every morning for 7 days, THEN 2 capsules (20 mg total) 2 (two) times daily for 23 days. 60 capsule 2  . cyclobenzaprine (FLEXERIL) 5 MG tablet Take 5 mg by mouth 3 (three) times daily as needed.    . umeclidinium bromide (INCRUSE ELLIPTA) 62.5 MCG/INH AEPB Inhale 1 puff into the lungs daily. 30 each 5   No current facility-administered medications for this visit.    No Known Allergies     Objective:  BP 115/76   Pulse 74   Temp 98.2 F (36.8 C) (Oral)   Wt 132 lb (59.9 kg)   SpO2 97%   BMI 23.38 kg/m   Gen:  alert, not ill-appearing, no distress, appropriate for age HEENT: head normocephalic without obvious abnormality, conjunctiva and cornea clear, trachea midline Pulm: Normal work of breathing, normal phonation, clear to auscultation bilaterally, no wheezes, rales or rhonchi CV: Normal rate, regular rhythm, s1 and s2 distinct, no murmurs, clicks or rubs  Neuro: alert and oriented x 3, no tremor MSK: left anterolateral rib tenderness, extremities atraumatic, normal gait and station Skin: intact, no rashes on exposed skin, no jaundice, no cyanosis Psych: well-groomed, cooperative, good eye contact, euthymic mood, affect mood-congruent, speech is articulate, and thought processes clear and goal-directed    No results found for this or any previous visit (from the past 72 hour(s)). No results found.    Assessment and Plan: 48 y.o. female with   .Raven Foster.  Diagnoses and all orders for this visit:  Rib Foster on right side -     DG Chest 2 View -     meloxicam (MOBIC) 15 MG tablet; One tab PO qAM with breakfast for 2 weeks, then daily prn Foster.  COPD with acute exacerbation ([place]) -     DG Chest 2 View  Pulmonary emphysema, unspecified emphysema type ([place]) Comments: dx on CT chest 08/2017. No spirometry. Declines smoking cessation. Started on Anoro 09/08/17 Orders: -     albuterol (PROAIR HFA) 108 (90 Base) MCG/ACT inhaler; Inhale 1-2 puffs into the lungs every 4 (four) hours as needed for wheezing or shortness of breath. -     umeclidinium-vilanterol (ANORO ELLIPTA) 62.5-25 MCG/INH AEPB; Inhale 1 puff into the lungs daily. TAKE 1 PUFF BY MOUTH EVERY DAY  Synovitis of right third PIP  Pulmonary emphysema, unspecified emphysema type (HCC) -     albuterol (PROAIR HFA) 108 (90 Base) MCG/ACT inhaler; Inhale 1-2 puffs into the lungs every 4 (four) hours as needed for wheezing or shortness of breath. -     umeclidinium-vilanterol (ANORO ELLIPTA) 62.5-25  MCG/INH AEPB; Inhale 1 puff into the lungs daily. TAKE 1 PUFF BY MOUTH EVERY DAY  Anxiety -     atomoxetine (STRATTERA) 10 MG capsule; Take 1 capsule (10 mg total) by mouth every morning for 7 days, THEN 2 capsules (20 mg total) 2 (two) times daily for 23 days.  Inattention -     atomoxetine (STRATTERA) 10  MG capsule; Take 1 capsule (10 mg total) by mouth every morning for 7 days, THEN 2 capsules (20 mg total) 2 (two) times daily for 23 days.   Afebrile, no tachypnea, no tachycardia, pulse ox 97% on RA at rest, no adventitious lung sounds She has been treated aggressively for respiratory infection/COPD exacerbation with Z-pak and Doxycycline and steroids. Okay to finish Medrol dose-pak Recommend CXR today to assess for infiltrate Encouraged to cont Anoro daily and Albuterol prn Encouraged to continue reducing number of cigarettes.  Treat chest wall Foster with Meloxicam 15 mg daily x 1-2 weeks  Anxiety GAD7=3 Start Strattera 10 mg for co-morbid inattention Option to self-titrate to 20 mg daily  Patient education and anticipatory guidance given Patient agrees with treatment plan Follow-up in 2 weeks or sooner as needed if symptoms worsen or fail to improve  Levonne Hubertharley E. Annamarie Yamaguchi PA-C

## 2019-04-05 ENCOUNTER — Ambulatory Visit: Payer: Managed Care, Other (non HMO) | Admitting: Physician Assistant

## 2019-04-05 ENCOUNTER — Other Ambulatory Visit: Payer: Self-pay

## 2019-04-05 ENCOUNTER — Encounter: Payer: Self-pay | Admitting: Physician Assistant

## 2019-04-05 VITALS — BP 103/76 | HR 89 | Temp 98.3°F | Resp 14 | Wt 140.0 lb

## 2019-04-05 DIAGNOSIS — R1013 Epigastric pain: Secondary | ICD-10-CM | POA: Diagnosis not present

## 2019-04-05 DIAGNOSIS — J439 Emphysema, unspecified: Secondary | ICD-10-CM | POA: Diagnosis not present

## 2019-04-05 DIAGNOSIS — Z122 Encounter for screening for malignant neoplasm of respiratory organs: Secondary | ICD-10-CM

## 2019-04-05 DIAGNOSIS — Z1211 Encounter for screening for malignant neoplasm of colon: Secondary | ICD-10-CM

## 2019-04-05 DIAGNOSIS — R1310 Dysphagia, unspecified: Secondary | ICD-10-CM

## 2019-04-05 MED ORDER — UMECLIDINIUM BROMIDE 62.5 MCG/INH IN AEPB: 1.0000 | INHALATION_SPRAY | Freq: Every day | RESPIRATORY_TRACT | 5 refills | Status: DC

## 2019-04-05 NOTE — Progress Notes (Signed)
HPI:                                                                Raven Foster is a 48 y.o. female who presents to [facility]: Primary Care Sports Medicine today for right rib pain  48 yo F with PMH of emphysema and tobacco use   Interval hx 04/05/19 Right rib pain - Her CXR on 03/18/19 was negative She took meloxicam 15 mg daily for 1 week and reports it was helpful for her right rib soreness. She has noticed that it also improves with stretching   She states that she thinks Anoro "might be too strong for me" because it causes rattling in her chest after using it She is still having a hard time expectorating mucus Current smoker 1.5 ppd x 5 years, 0.5 ppd x 32 years  She states she has burning epigastric pain and occasional trouble swallowing. Reports she has had an EGD/Colonscopy in 2013 and there was possibly some scar tissue above her cardiac sphincter and was told that this could be conerning for Barrett's esophagus. Denies family hx of Barrett's/esophageal cancer.  She was started on Straterra for concerns about inattention and "OCD." Reports she has a hard time "letting things go." She reports she obsesses "about everything." Reports being a "ball of nerves," and this is the reason that she smokes She states since starting Straterra she is not transposing numbers as much at work, she feels more organized.  Depression screen [facility] 2/9 04/05/2019 03/18/2019 09/08/2017  Decreased Interest 0 0 0  Down, Depressed, Hopeless 0 0 0  PHQ - 2 Score 0 0 0   GAD 7 : Generalized Anxiety Score 03/18/2019  Nervous, Anxious, on Edge 3  Control/stop worrying 0  Worry too much - different things 0  Trouble relaxing 0  Restless 0  Easily annoyed or irritable 0  Afraid - awful might happen 0  Total GAD 7 Score 3  Anxiety Difficulty Somewhat difficult      Past Medical History:  Diagnosis Date  . GERD (gastroesophageal reflux disease)   . IBS (irritable bowel syndrome)    . IUD (intrauterine device) in place 09/08/2017   Paraguard  . Kidney stone   . Lactose intolerance   . Pulmonary emphysema ([place])    Past Surgical History:  Procedure Laterality Date  . HERNIA REPAIR    . INTRAUTERINE DEVICE (IUD) INSERTION    . SEPTOPLASTY    . TONSILLECTOMY     Social History   Tobacco Use  . Smoking status: Current Every Day Smoker    Packs/day: 0.50    Years: 30.00    Pack years: 15.00    Types: Cigarettes  . Smokeless tobacco: Current User  Substance Use Topics  . Alcohol use: Yes    Comment: 1-2 q wk   family history includes COPD in her mother; Hypertension in her father and mother; Thyroid disease in her sister.    ROS: negative except as noted in the HPI  Medications: Current Outpatient Medications  Medication Sig Dispense Refill  . albuterol (PROAIR HFA) 108 (90 Base) MCG/ACT inhaler Inhale 1-2 puffs into the lungs every 4 (four) hours as needed for wheezing or shortness of breath. 6.7 g 3  .  atomoxetine (STRATTERA) 10 MG capsule Take 1 capsule (10 mg total) by mouth every morning for 7 days, THEN 2 capsules (20 mg total) 2 (two) times daily for 23 days. 60 capsule 2  . Calcium Carbonate-Vitamin D 600-400 MG-UNIT tablet Take 1 tablet by mouth 2 (two) times daily. 60 tablet 11  . cyclobenzaprine (FLEXERIL) 5 MG tablet Take 5 mg by mouth 3 (three) times daily as needed.    . meloxicam (MOBIC) 15 MG tablet One tab PO qAM with breakfast for 2 weeks, then daily prn pain. 30 tablet 1  . omeprazole (PRILOSEC) 40 MG capsule Take 40 mg by mouth daily.    Marland Kitchen umeclidinium-vilanterol (ANORO ELLIPTA) 62.5-25 MCG/INH AEPB Inhale 1 puff into the lungs daily. TAKE 1 PUFF BY MOUTH EVERY DAY 60 each 5  . umeclidinium bromide (INCRUSE ELLIPTA) 62.5 MCG/INH AEPB Inhale 1 puff into the lungs daily. 30 each 5   No current facility-administered medications for this visit.    No Known Allergies     Objective:  BP 103/76   Pulse 89   Temp 98.3 F (36.8 C)  (Oral)   Resp 14   Wt 140 lb (63.5 kg)   SpO2 98%   BMI 24.80 kg/m   Wt Readings from Last 3 Encounters:  04/05/19 140 lb (63.5 kg)  03/18/19 132 lb (59.9 kg)  06/15/18 135 lb (61.2 kg)   Temp Readings from Last 3 Encounters:  04/05/19 98.3 F (36.8 C) (Oral)  03/18/19 98.2 F (36.8 C) (Oral)  08/31/17 97.9 F (36.6 C) (Oral)   BP Readings from Last 3 Encounters:  04/05/19 103/76  03/18/19 115/76  06/15/18 119/82   Pulse Readings from Last 3 Encounters:  04/05/19 89  03/18/19 74  06/15/18 91    Gen:  alert, not ill-appearing, no distress, appropriate for age HEENT: head normocephalic without obvious abnormality, conjunctiva and cornea clear, trachea midline Pulm: Normal work of breathing, normal phonation, clear to auscultation bilaterally, no wheezes, rales or rhonchi CV: Normal rate, regular rhythm, s1 and s2 distinct, no murmurs, clicks or rubs  Neuro: alert and oriented x 3, fine resting tremor MSK: extremities atraumatic, normal gait and station Skin: intact, no rashes on exposed skin, no jaundice, no cyanosis Psych: well-groomed, cooperative, good eye contact, euthymic mood, affect mood-congruent, speech is articulate, and thought processes clear and goal-directed    No results found for this or any previous visit (from the past 72 hour(s)). No results found.    Assessment and Plan: 48 y.o. female with  .Raven Foster was seen today for follow-up.  Diagnoses and all orders for this visit:  Pulmonary emphysema, unspecified emphysema type (HCC) -     umeclidinium bromide (INCRUSE ELLIPTA) 62.5 MCG/INH AEPB; Inhale 1 puff into the lungs daily.  Encounter for screening for malignant neoplasm of respiratory organs -     CT CHEST LUNG CA SCREEN LOW DOSE W/O CM  Dysphagia, unspecified type -     COMPLETE METABOLIC PANEL WITH GFR -     CBC -     Ambulatory referral to Gastroenterology -     Lipase  Epigastric pain -     COMPLETE METABOLIC PANEL WITH GFR -      CBC -     Ambulatory referral to Gastroenterology -     Lipase  Colon cancer screening -     Ambulatory referral to Gastroenterology   COPD - switching from Anora to Incruse due to potential AE from LABA CXR negative Consider CT  chest if symptoms persist or worsen. Last CT chest 08/2017 negative for pulmonary nodules with mild emphysema/scarring   Patient education and anticipatory guidance given Patient agrees with treatment plan Follow-up in 6 weeks as needed if symptoms worsen or fail to improve  Levonne Hubertharley E. Elinore Shults PA-C

## 2019-04-05 NOTE — Patient Instructions (Addendum)
Call [phone] to schedule mammogram  Insurance may not cover CT chest for asymptomatic lung cancer screening because you need to have 30 packyears We can order a CT chest w/o contrast for emphysema/increased mucus but this would not be screening, this would be diagnostic and may have an out-of-pocket cost. Would recommend doing it if you feel your COPD symptoms are getting worse

## 2019-04-06 LAB — CBC
HCT: 39.4 % (ref 35.0–45.0)
Hemoglobin: 13.1 g/dL (ref 11.7–15.5)
MCH: 29.4 pg (ref 27.0–33.0)
MCHC: 33.2 g/dL (ref 32.0–36.0)
MCV: 88.5 fL (ref 80.0–100.0)
MPV: 11.2 fL (ref 7.5–12.5)
Platelets: 292 10*3/uL (ref 140–400)
RBC: 4.45 10*6/uL (ref 3.80–5.10)
RDW: 12.7 % (ref 11.0–15.0)
WBC: 7.6 10*3/uL (ref 3.8–10.8)

## 2019-04-06 LAB — COMPLETE METABOLIC PANEL WITH GFR
AG Ratio: 2 (calc) (ref 1.0–2.5)
ALT: 12 U/L (ref 6–29)
AST: 15 U/L (ref 10–35)
Albumin: 4.7 g/dL (ref 3.6–5.1)
Alkaline phosphatase (APISO): 60 U/L (ref 37–153)
BUN: 13 mg/dL (ref 7–25)
CO2: 29 mmol/L (ref 20–32)
Calcium: 10.2 mg/dL (ref 8.6–10.4)
Chloride: 105 mmol/L (ref 98–110)
Creat: 0.71 mg/dL (ref 0.50–1.05)
GFR, Est African American: 115 mL/min/{1.73_m2} (ref >=60)
GFR, Est Non African American: 99 mL/min/{1.73_m2} (ref >=60)
Globulin: 2.3 g/dL (calc) (ref 1.9–3.7)
Glucose, Bld: 86 mg/dL (ref 65–99)
Potassium: 5.1 mmol/L (ref 3.5–5.3)
Sodium: 142 mmol/L (ref 135–146)
Total Bilirubin: 0.5 mg/dL (ref 0.2–1.2)
Total Protein: 7 g/dL (ref 6.1–8.1)

## 2019-04-06 LAB — LIPASE: Lipase: 27 U/L (ref 7–60)

## 2019-04-09 ENCOUNTER — Encounter: Payer: Self-pay | Admitting: Physician Assistant

## 2019-04-09 DIAGNOSIS — R41840 Attention and concentration deficit: Secondary | ICD-10-CM | POA: Insufficient documentation

## 2019-04-09 DIAGNOSIS — F419 Anxiety disorder, unspecified: Secondary | ICD-10-CM | POA: Insufficient documentation

## 2019-04-09 DIAGNOSIS — R0781 Pleurodynia: Secondary | ICD-10-CM | POA: Insufficient documentation

## 2019-05-17 ENCOUNTER — Other Ambulatory Visit: Payer: Self-pay

## 2019-05-17 ENCOUNTER — Ambulatory Visit: Payer: Managed Care, Other (non HMO) | Admitting: Osteopathic Medicine

## 2019-05-17 ENCOUNTER — Encounter: Payer: Self-pay | Admitting: Osteopathic Medicine

## 2019-05-17 DIAGNOSIS — J439 Emphysema, unspecified: Secondary | ICD-10-CM

## 2019-05-17 DIAGNOSIS — R0781 Pleurodynia: Secondary | ICD-10-CM | POA: Diagnosis not present

## 2019-05-17 DIAGNOSIS — F419 Anxiety disorder, unspecified: Secondary | ICD-10-CM | POA: Diagnosis not present

## 2019-05-17 DIAGNOSIS — R41840 Attention and concentration deficit: Secondary | ICD-10-CM | POA: Diagnosis not present

## 2019-05-17 MED ORDER — ATOMOXETINE HCL 10 MG PO CAPS: 10.0000 mg | ORAL_CAPSULE | Freq: Two times a day (BID) | ORAL | 1 refills | Status: DC

## 2019-05-17 MED ORDER — UMECLIDINIUM BROMIDE 62.5 MCG/INH IN AEPB: 1.0000 | INHALATION_SPRAY | Freq: Every day | RESPIRATORY_TRACT | 11 refills | Status: AC

## 2019-05-17 MED ORDER — MELOXICAM 15 MG PO TABS: 7.5000 mg | ORAL_TABLET | Freq: Every day | ORAL | 1 refills | Status: AC | PRN

## 2019-05-17 MED ORDER — ALBUTEROL SULFATE HFA 108 (90 BASE) MCG/ACT IN AERS: 1.0000 | INHALATION_SPRAY | RESPIRATORY_TRACT | 3 refills | Status: AC | PRN

## 2019-05-17 NOTE — Progress Notes (Signed)
HPI: Raven Foster is a 48 y.o. female who  has a past medical history of GERD (gastroesophageal reflux disease), IBS (irritable bowel syndrome), IUD (intrauterine device) in place (09/08/2017), Kidney stone, Lactose intolerance, and Pulmonary emphysema ([place]).  she presents to [facility] today, 05/17/19,  for chief complaint of:  Follow-up on medications  Emphysema: Anoro helping, better, able to perform all ADLs without issue  ADD: Strattera helping. Concentration is a lot better, can remember numbers etc. Happy with this medicine   No other concerns today!       At today's visit 05/17/19 ... PMH, PSH, FH reviewed and updated as needed.  Current medication list and allergy/intolerance hx reviewed and updated as needed. (See remainder of HPI, ROS, Phys Exam below)   No results found.  No results found for this or any previous visit (from the past 72 hour(s)).        ASSESSMENT/PLAN: Diagnoses of Pulmonary emphysema, unspecified emphysema type ([place]), Rib pain on right side, Anxiety, Inattention, and Pulmonary emphysema, unspecified emphysema type ([place]) were pertinent to this visit.  OK to continue current medications as is.  If any issues arise, please let me know!  Meds ordered this encounter  Medications  . albuterol (PROAIR HFA) 108 (90 Base) MCG/ACT inhaler    Sig: Inhale 1-2 puffs into the lungs every 4 (four) hours as needed for wheezing or shortness of breath.    Dispense:  6.7 g    Refill:  3  . meloxicam (MOBIC) 15 MG tablet    Sig: Take 0.5-1 tablets (7.5-15 mg total) by mouth daily as needed for pain.    Dispense:  90 tablet    Refill:  1  . atomoxetine (STRATTERA) 10 MG capsule    Sig: Take 1 capsule (10 mg total) by mouth 2 (two) times daily with a meal.    Dispense:  180 capsule    Refill:  1  . umeclidinium bromide (INCRUSE ELLIPTA) 62.5 MCG/INH AEPB    Sig: Inhale 1 puff into the lungs daily.    Dispense:  30  each    Refill:  11      Follow-up plan: Return for ANNUAL (call week prior to visit for lab orders) around 04/2020, see me sooner if needed .                                                 ################################################# ################################################# ################################################# #################################################    Current Meds  Medication Sig  . albuterol (PROAIR HFA) 108 (90 Base) MCG/ACT inhaler Inhale 1-2 puffs into the lungs every 4 (four) hours as needed for wheezing or shortness of breath.  . Calcium Carbonate-Vitamin D 600-400 MG-UNIT tablet Take 1 tablet by mouth 2 (two) times daily.  . cyclobenzaprine (FLEXERIL) 5 MG tablet Take 5 mg by mouth 3 (three) times daily as needed.  . meloxicam (MOBIC) 15 MG tablet Take 0.5-1 tablets (7.5-15 mg total) by mouth daily as needed for pain.  Marland Kitchen omeprazole (PRILOSEC) 40 MG capsule Take 40 mg by mouth daily.  Marland Kitchen umeclidinium bromide (INCRUSE ELLIPTA) 62.5 MCG/INH AEPB Inhale 1 puff into the lungs daily.  . [DISCONTINUED] albuterol (PROAIR HFA) 108 (90 Base) MCG/ACT inhaler Inhale 1-2 puffs into the lungs every 4 (four) hours as needed for wheezing or shortness of breath.  . [DISCONTINUED] meloxicam ([place])  15 MG tablet One tab PO qAM with breakfast for 2 weeks, then daily prn pain.  . [DISCONTINUED] umeclidinium bromide (INCRUSE ELLIPTA) 62.5 MCG/INH AEPB Inhale 1 puff into the lungs daily.  . [DISCONTINUED] umeclidinium-vilanterol (ANORO ELLIPTA) 62.5-25 MCG/INH AEPB Inhale 1 puff into the lungs daily. TAKE 1 PUFF BY MOUTH EVERY DAY    No Known Allergies     Review of Systems:  Constitutional: No recent illness  HEENT: No  headache, no vision change  Cardiac: No  chest pain, No  pressure, No palpitations  Respiratory:  No  shortness of breath. No  Cough  Neurologic: No  weakness, No   Dizziness  Psychiatric: No  concerns with depression, No  concerns with anxiety  Exam:  BP 112/80   Pulse 76   Temp 98.3 F (36.8 C) (Oral)   Ht 5\' 3"  (1.6 m)   Wt 136 lb 3.2 oz (61.8 kg)   BMI 24.13 kg/m   Constitutional: VS see above. General Appearance: alert, well-developed, well-nourished, NAD  Neck: No masses, trachea midline.   Respiratory: Normal respiratory effort. no wheeze, no rhonchi, no rales  Cardiovascular: S1/S2 normal, no murmur, no rub/gallop auscultated. RRR.   Musculoskeletal: Gait normal. Symmetric and independent movement of all extremities  Neurological: Normal balance/coordination. No tremor.  Skin: warm, dry, intact.   Psychiatric: Normal judgment/insight. Normal mood and affect. Oriented x3.       Visit summary with medication list and pertinent instructions was printed for patient to review, patient was advised to alert if any updates are needed. All questions at time of visit were answered - patient instructed to contact office with any additional concerns. ER/RTC precautions were reviewed with the patient and understanding verbalized.   Note: Total time spent 15 minutes, greater than 50% of the visit was spent face-to-face counseling and coordinating care for the following: Diagnoses of Pulmonary emphysema, unspecified emphysema type (HCC), Rib pain on right side, Anxiety, Inattention, and Pulmonary emphysema, unspecified emphysema type (HCC) were pertinent to this visit.[place]  Please note: voice recognition software was used to produce this document, and typos may escape review. Please contact Dr. Marland Kitchen for any needed clarifications.    Follow up plan: Return for ANNUAL (call week prior to visit for lab orders) around 04/2020, see me sooner if needed .

## 2019-07-09 ENCOUNTER — Encounter: Payer: Self-pay | Admitting: Physician Assistant

## 2019-07-09 ENCOUNTER — Ambulatory Visit (INDEPENDENT_AMBULATORY_CARE_PROVIDER_SITE_OTHER): Payer: Managed Care, Other (non HMO) | Admitting: Physician Assistant

## 2019-07-09 VITALS — Ht 63.5 in | Wt 136.0 lb

## 2019-07-09 DIAGNOSIS — B9689 Other specified bacterial agents as the cause of diseases classified elsewhere: Secondary | ICD-10-CM | POA: Diagnosis not present

## 2019-07-09 DIAGNOSIS — N761 Subacute and chronic vaginitis: Secondary | ICD-10-CM

## 2019-07-09 DIAGNOSIS — J019 Acute sinusitis, unspecified: Secondary | ICD-10-CM | POA: Diagnosis not present

## 2019-07-09 DIAGNOSIS — Z20828 Contact with and (suspected) exposure to other viral communicable diseases: Secondary | ICD-10-CM | POA: Diagnosis not present

## 2019-07-09 MED ORDER — FLUCONAZOLE 150 MG PO TABS: 150.0000 mg | ORAL_TABLET | Freq: Once | ORAL | 0 refills | Status: AC

## 2019-07-09 MED ORDER — AMOXICILLIN-POT CLAVULANATE 875-125 MG PO TABS: 1.0000 | ORAL_TABLET | Freq: Two times a day (BID) | ORAL | 0 refills | Status: AC

## 2019-07-09 NOTE — Progress Notes (Signed)
Sinus drainage x 1 week , no fever.drainage has color.

## 2019-07-09 NOTE — Progress Notes (Signed)
Virtual Visit via Video (App used: Doximity) Note  I connected with      Raven Foster on 07/09/19 at 9:51 AM  by a telemedicine application and verified that I am speaking with the correct person using two identifiers.  Patient is located in [place], [place] I am in office   I discussed the limitations of evaluation and management by telemedicine and the availability of in person appointments. The patient expressed understanding and agreed to proceed.  History of Present Illness: Raven Foster is a 48 y.o. female with PMH of chronic vasomotor rhinitis, COPD, tobacco use who would like to discuss "sinus drainage"    Onset 1 week ago Started with rhinorrhea, malaise, sneezing. Gradually worsening. Now has left-sided sinus pressure in the frontal area, left-sided swollen glands in her neck, post-nasal drip, "hacking cough" that feels triggered by drainage that is collecting in her throat. Sputum has a yellow color. She has a history of sinusitis and symptoms feel similar to infection in August 2020. Denies fever, loss of taste/smell, SOB, chest pain. Denies nausea, diarrhea, GI upset. Household contacts are ill with similar symptoms.  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 She does use boric acid suppositories for recurrent BV.  Observations/Objective: Ht 5' 3.5" (1.613 m)   Wt 136 lb (61.7 kg)   BMI 23.71 kg/m  BP Readings from Last 3 Encounters:  05/17/19 112/80  04/05/19 103/76  03/18/19 115/76   Exam limited by video visit: Gen: alert, well-appearing, no acute distress Pulm: normal work of breathing, speaking in full sentences Mental status: alert and oriented x 3,  Psych: cooperative, euthymic mood, affect mood-congruent, speech is articulate, normal rate and volume; thought processes clear and  goal-directed, normal judgment, good insight   Lab and Radiology Results No results found for this or any previous visit (from the past 72 hour(s)). No results found.     Assessment and Plan: 48 y.o. female with The primary encounter diagnosis was Acute bacterial sinusitis. Diagnoses of Chronic vaginitis and Contact with and (suspected) exposure to other viral communicable diseases were also pertinent to this visit.   Raven Foster was seen today for sinus problem.  Diagnoses and all orders for this visit:  Acute bacterial sinusitis -     amoxicillin-clavulanate (AUGMENTIN) 875-125 MG tablet; Take 1 tablet by mouth 2 (two) times daily for 10 days.  Chronic vaginitis -     fluconazole (DIFLUCAN) 150 MG tablet; Take 1 tablet (150 mg total) by mouth once for 1 dose. May repeat 1 additional dose in 72 hours if needed  Contact with and (suspected) exposure to other viral communicable diseases -     Novel Coronavirus, NAA ([facility])   1. Sinusitis Gradually worsening symptoms x 1 week, hx of recurrent sinusitis (last treated with ABX 3 months ago) Discussed likely viral etiology and risks benefits of ABX Start empiric Augmentin bid x 10 days Rx for Fluconazole prn for signs/symptoms of candidal vaginitis  2. COVID-like illness Unable to provide VS. No high risk symptoms. No known exposure. No signs of PNA Will come to [facility] for drive-up testing at 59:93 am SARS-CoV2 PCR pending Counseled to isolate at home pending result. If negative, can be released from isolation precautions Counseled on supportive care  There are no Patient Instructions on file for this visit.  Instructions sent via MyChart. If MyChart not available, pt was given option for info via personal e-mail w/ no guarantee of protected health info over unsecured e-mail communication, and MyChart sign-up instructions were sent to patient.   Follow Up Instructions: No follow-ups on file.    I discussed the assessment  and treatment plan with the patient. The patient was provided an opportunity to ask questions and all were answered. The patient agreed with the plan and demonstrated an understanding of the instructions.   The patient was advised to call back or seek an in-person evaluation if any new concerns, if symptoms worsen or if the condition fails to improve as anticipated.  17 minutes of non-face-to-face time was provided during this encounter.      . . . . . . . . . . . . . Marland Kitchen                   Historical information moved to improve visibility of documentation.  Past Medical History:  Diagnosis Date  . GERD (gastroesophageal reflux disease)   . IBS (irritable bowel syndrome)   . IUD (intrauterine device) in place 09/08/2017   Paraguard  . Kidney stone   . Lactose intolerance   . Pulmonary emphysema ([place])    Past Surgical History:  Procedure Laterality Date  . HERNIA REPAIR    . INTRAUTERINE DEVICE (IUD) INSERTION    . SEPTOPLASTY    . TONSILLECTOMY     Social History   Tobacco Use  . Smoking status: Current Every Day Smoker    Packs/day: 0.50    Years: 30.00    Pack years: 15.00    Types: Cigarettes  . Smokeless tobacco: Current User  Substance Use Topics  . Alcohol use: Yes    Comment: 1-2 q wk   family history includes COPD in her mother; Hypertension in her father and mother; Thyroid disease in her sister.  Medications: Current Outpatient Medications  Medication Sig Dispense Refill  . albuterol (PROAIR HFA) 108 (90 Base) MCG/ACT inhaler Inhale 1-2 puffs into the lungs every 4 (four) hours as needed for wheezing or shortness of breath. 6.7 g 3  . atomoxetine (STRATTERA) 10 MG capsule Take 1 capsule (10 mg total) by mouth 2 (two) times daily with a meal. 180 capsule 1  . Calcium Carbonate-Vitamin D 600-400 MG-UNIT tablet Take 1 tablet by mouth 2 (two) times daily. 60 tablet 11  . cyclobenzaprine (FLEXERIL) 5 MG tablet Take 5 mg by mouth 3  (three) times daily as needed.    . meloxicam (MOBIC) 15 MG tablet Take 0.5-1 tablets (7.5-15 mg total) by mouth daily as needed for pain. 90 tablet 1  . omeprazole (PRILOSEC) 40 MG capsule Take 40 mg by mouth daily.    Marland Kitchen umeclidinium bromide (INCRUSE ELLIPTA) 62.5 MCG/INH AEPB Inhale 1 puff into the lungs daily. 30 each 11  . amoxicillin-clavulanate (AUGMENTIN) 875-125 MG tablet Take 1 tablet by mouth 2 (two) times daily for 10 days. 20 tablet 0  . fluconazole (DIFLUCAN) 150 MG tablet Take 1 tablet (150 mg total) by mouth once for 1 dose. May repeat 1 additional dose in 72 hours if needed 2 tablet 0   No current facility-administered medications for this visit.    No Known Allergies

## 2019-07-10 LAB — NOVEL CORONAVIRUS, NAA: SARS-CoV-2, NAA: NOT DETECTED

## 2019-10-28 DIAGNOSIS — Z20822 Contact with and (suspected) exposure to covid-19: Secondary | ICD-10-CM | POA: Diagnosis not present

## 2020-01-31 DIAGNOSIS — R309 Painful micturition, unspecified: Secondary | ICD-10-CM | POA: Diagnosis not present

## 2020-01-31 DIAGNOSIS — Z01411 Encounter for gynecological examination (general) (routine) with abnormal findings: Secondary | ICD-10-CM | POA: Diagnosis not present

## 2020-01-31 DIAGNOSIS — Z13220 Encounter for screening for lipoid disorders: Secondary | ICD-10-CM | POA: Diagnosis not present

## 2020-01-31 DIAGNOSIS — Z0000 Encounter for general adult medical examination without abnormal findings: Secondary | ICD-10-CM | POA: Diagnosis not present

## 2020-01-31 DIAGNOSIS — Z6821 Body mass index (BMI) 21.0-21.9, adult: Secondary | ICD-10-CM | POA: Diagnosis not present

## 2020-01-31 DIAGNOSIS — Z30432 Encounter for removal of intrauterine contraceptive device: Secondary | ICD-10-CM | POA: Diagnosis not present

## 2020-03-23 DIAGNOSIS — Z20822 Contact with and (suspected) exposure to covid-19: Secondary | ICD-10-CM | POA: Diagnosis not present

## 2020-04-17 ENCOUNTER — Encounter: Payer: Managed Care, Other (non HMO) | Admitting: Osteopathic Medicine

## 2020-04-26 ENCOUNTER — Other Ambulatory Visit: Payer: Self-pay | Admitting: Osteopathic Medicine

## 2020-04-26 DIAGNOSIS — R41840 Attention and concentration deficit: Secondary | ICD-10-CM

## 2020-04-26 DIAGNOSIS — F419 Anxiety disorder, unspecified: Secondary | ICD-10-CM

## 2020-07-03 DIAGNOSIS — K589 Irritable bowel syndrome without diarrhea: Secondary | ICD-10-CM | POA: Diagnosis not present

## 2020-07-03 DIAGNOSIS — R197 Diarrhea, unspecified: Secondary | ICD-10-CM | POA: Diagnosis not present

## 2020-07-03 DIAGNOSIS — K219 Gastro-esophageal reflux disease without esophagitis: Secondary | ICD-10-CM | POA: Diagnosis not present

## 2020-07-03 DIAGNOSIS — K5900 Constipation, unspecified: Secondary | ICD-10-CM | POA: Diagnosis not present

## 2020-07-17 DIAGNOSIS — N200 Calculus of kidney: Secondary | ICD-10-CM | POA: Diagnosis not present

## 2020-07-17 DIAGNOSIS — K5900 Constipation, unspecified: Secondary | ICD-10-CM | POA: Diagnosis not present

## 2020-07-17 DIAGNOSIS — K219 Gastro-esophageal reflux disease without esophagitis: Secondary | ICD-10-CM | POA: Diagnosis not present

## 2020-07-17 DIAGNOSIS — K589 Irritable bowel syndrome without diarrhea: Secondary | ICD-10-CM | POA: Diagnosis not present

## 2020-07-28 ENCOUNTER — Encounter: Payer: Self-pay | Admitting: Osteopathic Medicine

## 2020-07-28 ENCOUNTER — Other Ambulatory Visit: Payer: Self-pay

## 2020-07-28 ENCOUNTER — Ambulatory Visit (INDEPENDENT_AMBULATORY_CARE_PROVIDER_SITE_OTHER): Payer: BC Managed Care – PPO | Admitting: Osteopathic Medicine

## 2020-07-28 VITALS — BP 112/75 | HR 97 | Temp 98.1°F | Wt 122.4 lb

## 2020-07-28 DIAGNOSIS — F419 Anxiety disorder, unspecified: Secondary | ICD-10-CM

## 2020-07-28 MED ORDER — ESOMEPRAZOLE MAGNESIUM 40 MG PO CPDR: 40.0000 mg | DELAYED_RELEASE_CAPSULE | Freq: Every day | ORAL | 0 refills | Status: AC

## 2020-07-28 MED ORDER — ESCITALOPRAM OXALATE 5 MG PO TABS: 5.0000 mg | ORAL_TABLET | Freq: Every day | ORAL | 0 refills | Status: DC

## 2020-07-28 NOTE — Patient Instructions (Signed)
ANXIETY  Starting Lexapro 5 mg   STOP Strattera at least for now   STOMACH PAIN  6 week course Nexium      CONSTIPATION:  ALL THE TIME:  Lifestyle measures  Hydration: drink plenty of water  Physical activity: at least walking daily  High-fiber foods Bulk forming laxatives   Psyllium (Konsyl; Metamucil; Perdiem)  Methylcellulose (Citrucel)  Calcium polycarbophil (FiberCon; Fiber-Lax; Mitrolan)  Wheat dextrin (Benefiber)  AS NEEDED FOR 3-5 DAYS AT A TIME OR ALL THE TIME 1-2 TIMES PER WEEK  (CAN TAKE ONE FROM EACH CATEGORY E.G. MIRALAX + SENNA) Hyperosmolar or saline laxatives:  Polyethylene glycol (MiraLAX, GlycoLax)  Lactulose  Sorbitol  Magnesium hydroxide (Milk of Magnesia)   Magnesium citrate (Evac-Q-Mag)  Stimulant laxatives  Senna (eg, Black Draught, Ex-Lax, Fletcher's, Castoria, Senokot)   Bisacodyl (eg, Correctol, Doxidan, Dulcolax). Taking stimulant laxatives regularly or in large amounts can cause side effects, including low potassium levels. Thus, you should take these drugs carefully if you must use them regularly.  PRESCRIPTIONS that treat severe constipation. They are expensive, but may be recommended if you do not respond to other treatments.  Lubiprostone (Amitiza)  Linaclotide (Linzess)  Plecanatide (Trulance, Motegrity)  Tenapanor Allena Napoleon)

## 2020-07-28 NOTE — Progress Notes (Signed)
Raven Foster is a 49 y.o. female who presents to  [facility]  today, 07/28/20, seeking care for the following:  . Follow-up mental health, last visit here was about a year ago. Reports more anxiety. Taking low dose Strattera daily expet weekends . Concern for GERD getting worse. No N/V, no pain w/ eating. Concern for more frequent constipation       ASSESSMENT & PLAN with other pertinent findings:  The encounter diagnosis was Anxiety.   No results found for this or any previous visit (from the past 24 hour(s)).   Patient Instructions  ANXIETY  Starting Lexapro 5 mg   STOP Strattera at least for now   STOMACH PAIN  6 week course Nexium      CONSTIPATION:  ALL THE TIME:  Lifestyle measures  Hydration: drink plenty of water  Physical activity: at least walking daily  High-fiber foods Bulk forming laxatives   Psyllium (Konsyl; Metamucil; Perdiem)  Methylcellulose (Citrucel)  Calcium polycarbophil (FiberCon; Fiber-Lax; Mitrolan)  Wheat dextrin (Benefiber)  AS NEEDED FOR 3-5 DAYS AT A TIME OR ALL THE TIME 1-2 TIMES PER WEEK  (CAN TAKE ONE FROM EACH CATEGORY E.G. MIRALAX + SENNA) Hyperosmolar or saline laxatives:  Polyethylene glycol (MiraLAX, GlycoLax)  Lactulose  Sorbitol  Magnesium hydroxide (Milk of Magnesia)   Magnesium citrate (Evac-Q-Mag)  Stimulant laxatives  Senna (eg, Black Draught, Ex-Lax, Fletcher's, Castoria, Senokot)   Bisacodyl (eg, Correctol, Doxidan, Dulcolax). Taking stimulant laxatives regularly or in large amounts can cause side effects, including low potassium levels. Thus, you should take these drugs carefully if you must use them regularly.  PRESCRIPTIONS that treat severe constipation. They are expensive, but may be recommended if you do not respond to other treatments.  Lubiprostone (Amitiza)  Linaclotide (Linzess)  Plecanatide (Trulance,  Motegrity)  Tenapanor Allena Napoleon)      No orders of the defined types were placed in this encounter.   Meds ordered this encounter  Medications  . escitalopram (LEXAPRO) 5 MG tablet    Sig: Take 1 tablet (5 mg total) by mouth daily.    Dispense:  90 tablet    Refill:  0  . esomeprazole (NEXIUM) 40 MG capsule    Sig: Take 1 capsule (40 mg total) by mouth daily at 12 noon.    Dispense:  45 capsule    Refill:  0       Follow-up instructions: Return in about 6 weeks (around 09/08/2020) for ANNUAL CHECK-UP - SEE [place] SOONER IF NEEDED, RECHECK ON NEW MEDICATION.                                         BP 112/75   Pulse 97   Temp 98.1 F (36.7 C)   Wt 122 lb 6.4 oz (55.5 kg)   SpO2 100%   BMI 21.34 kg/m   Current Meds  Medication Sig  . albuterol (PROAIR HFA) 108 (90 Base) MCG/ACT inhaler Inhale 1-2 puffs into the lungs every 4 (four) hours as needed for wheezing or shortness of breath.  . Calcium Carbonate-Vitamin D 600-400 MG-UNIT tablet Take 1 tablet by mouth 2 (two) times daily.  . cyclobenzaprine (FLEXERIL) 5 MG tablet Take 5 mg by mouth 3 (three) times daily as needed.  Marland Kitchen escitalopram (LEXAPRO) 5 MG tablet Take 1 tablet (5 mg total) by mouth daily.  Marland Kitchen esomeprazole (  NEXIUM) 40 MG capsule Take 1 capsule (40 mg total) by mouth daily at 12 noon.  . hyoscyamine (LEVBID) 0.375 MG 12 hr tablet Take by mouth.  . meloxicam (MOBIC) 15 MG tablet Take 0.5-1 tablets (7.5-15 mg total) by mouth daily as needed for pain.  Marland Kitchen umeclidinium bromide (INCRUSE ELLIPTA) 62.5 MCG/INH AEPB Inhale 1 puff into the lungs daily.  . [DISCONTINUED] atomoxetine (STRATTERA) 10 MG capsule TAKE 1 CAPSULE (10 MG TOTAL) BY MOUTH 2 (TWO) TIMES DAILY WITH A MEAL.  . [DISCONTINUED] omeprazole (PRILOSEC) 40 MG capsule Take 40 mg by mouth daily.    No results found for this or any previous visit (from the past 72 hour(s)).  No results found.     All questions at time of  visit were answered - patient instructed to contact office with any additional concerns or updates.  ER/RTC precautions were reviewed with the patient as applicable.   Please note: voice recognition software was used to produce this document, and typos may escape review. Please contact Dr. Lyn Hollingshead for any needed clarifications.  Marland Kitchen

## 2020-08-07 DIAGNOSIS — R197 Diarrhea, unspecified: Secondary | ICD-10-CM | POA: Diagnosis not present

## 2020-08-07 DIAGNOSIS — K589 Irritable bowel syndrome without diarrhea: Secondary | ICD-10-CM | POA: Diagnosis not present

## 2020-08-07 DIAGNOSIS — K5900 Constipation, unspecified: Secondary | ICD-10-CM | POA: Diagnosis not present

## 2020-08-07 DIAGNOSIS — K219 Gastro-esophageal reflux disease without esophagitis: Secondary | ICD-10-CM | POA: Diagnosis not present

## 2020-08-14 ENCOUNTER — Telehealth (INDEPENDENT_AMBULATORY_CARE_PROVIDER_SITE_OTHER): Payer: BC Managed Care – PPO | Admitting: Nurse Practitioner

## 2020-08-14 ENCOUNTER — Encounter: Payer: Self-pay | Admitting: Nurse Practitioner

## 2020-08-14 DIAGNOSIS — F419 Anxiety disorder, unspecified: Secondary | ICD-10-CM

## 2020-08-14 DIAGNOSIS — R41840 Attention and concentration deficit: Secondary | ICD-10-CM | POA: Diagnosis not present

## 2020-08-14 DIAGNOSIS — Z20822 Contact with and (suspected) exposure to covid-19: Secondary | ICD-10-CM | POA: Diagnosis not present

## 2020-08-14 MED ORDER — ATOMOXETINE HCL 10 MG PO CAPS: 10.0000 mg | ORAL_CAPSULE | Freq: Two times a day (BID) | ORAL | 0 refills | Status: DC

## 2020-08-14 MED ORDER — PSEUDOEPHEDRINE-GUAIFENESIN 30-100 MG/5ML PO SYRP: 5.0000 mL | ORAL_SOLUTION | ORAL | 1 refills | Status: DC | PRN

## 2020-08-14 NOTE — Patient Instructions (Signed)
Over the counter medications that may be helpful for symptoms: . Guaifenesin 1200 mg extended release tabs twice daily, with plenty of water o For cough and congestion o Brand name: Mucinex   . Pseudoephedrine 30 mg, one or two tabs every 4 to 6 hours o For sinus congestion o Brand name: Sudafed o You must get this from the pharmacy counter.  . Oxymetazoline nasal spray each morning, one spray in each nostril, for NO MORE THAN 3 days  o For nasal and sinus congestion o Brand name: Afrin . Saline nasal spray or Saline Nasal Irrigation 3-5 times a day o For nasal and sinus congestion o Brand names: Ocean or AYR . Fluticasone nasal spray, one spray in each nostril, each morning after oxymetazoline and saline, if used o For nasal and sinus congestion o Brand name: Flonase . Warm salt water gargles  o For sore throat o Every few hours as needed . Alternate ibuprofen 400-600 mg and acetaminophen 1000 mg every 4-6 hours o For fever, body aches, headache o Brand names: Motrin or Advil and Tylenol . Dextromethorphan 12-hour cough version 30 mg every 12 hours  o For cough o Brand name: Delsym Stop all other cold medications for now (Nyquil, Dayquil, Tylenol Cold, Theraflu, etc) and other non-prescription cough/cold preparations. Many of these have the same ingredients listed above and could cause an overdose of medication.   General Instructions . Allow your body to rest . Drink PLENTY of fluids . Isolate yourself from everyone, even family, until test results have returned  If your COVID-19 test is positive . Then you ARE INFECTED and you can pass the virus to others . You must quarantine from others for a minimum of  o 10 days since symptoms started AND o You are fever free for 24 hours WITHOUT any medication to reduce fever AND o Your symptoms are improving . Do not go to the store or other public areas . Do not go around household members who are not known to be infected with  COVID-19 . If you MUST leave you area of quarantine (example: go to a bathroom you share with others in your home), you must o Wear a mask o Wash your hands thoroughly o Wipe down any surfaces you touch . Do not share food, drinks, towels, or other items with other persons . Dispose of your own tissues, food containers, etc  Once you have recovered, please continue good preventive care measures, including:  . wearing a mask when in public . wash your hands frequently . avoid touching your face/nose/eyes . cover coughs/sneezes with the inside of your elbow . stay out of crowds . keep a 6 foot distance from others  Go to the nearest hospital emergency room if fever/cough/breathlessness are severe or illness seems like a threat to life.   

## 2020-08-14 NOTE — Addendum Note (Signed)
Addended by: Thermon Leyland on: 08/14/2020 11:34 AM   Modules accepted: Orders

## 2020-08-14 NOTE — Progress Notes (Signed)
Virtual Video Visit via MyChart Note  I connected with  Raven Foster on 08/14/20 at  8:50 AM EST by the video enabled telemedicine application for , MyChart, and verified that I am speaking with the correct person using two identifiers.   I introduced myself as a Publishing rights manager with the practice. We discussed the limitations of evaluation and management by telemedicine and the availability of in person appointments. The patient expressed understanding and agreed to proceed.  Participating parties in this visit include: The patient and the nurse practitioner listed.  The patient is: At home I am: In the office  Subjective:    CC:  Chief Complaint  Patient presents with  . Sinusitis    HPI: Raven Foster is a 50 y.o. year old female presenting today via MyChart today for sinusitis symptoms.   She tells me that she had a stuffy nose and ear fullness on Saturday and used a nasal spray. She ended up having some vertigo and got sick after using the spray, but that resolved by the end of the evening.   She endorses sinus pain and pressure, pain in her lower back and neck, rhinorrhea, post nasal drip, mild fatigue, cough.   Denies fever, chills, shortness of breath, loss of taste or loss of smell, sore throat, chest pain, wheezing.   She is not vaccinated and does not wish to have the vaccine.  She is an every day smoker.   Her family has not had any symptoms. She works at a [employer] and has possible exposure through work.   Past medical history, Surgical history, Family history not pertinant except as noted below, Social history, Allergies, and medications have been entered into the medical record, reviewed, and corrections made.   Review of Systems:  All review of systems negative except what is listed in the HPI   Objective:    General:  Speaking clearly in complete sentences. Absent shortness of breath noted.   Alert and oriented x3.   Normal judgment.   Absent acute distress. Audible congestion.   Impression and Recommendations:    1. Encounter by telehealth for suspected COVID-19 Symptoms and presentation consistent with possible COVID-19 infection or sinusitis. Will plan for testing in the office today for COVID.  Recommendations for OTC medication that may be helpful to manage symptoms. She does not take pills easily, therefore liquid medication sent to pharmacy.  If COVID testing is negative, we will start antibiotic treatment for sinusitis given the length of time symptoms have been present.  Discussed monitoring of symptoms and when to seek emergency evaluation.   Follow-up if symptoms worsen or fail to improve.    I discussed the assessment and treatment plan with the patient. The patient was provided an opportunity to ask questions and all were answered. The patient agreed with the plan and demonstrated an understanding of the instructions.   The patient was advised to call back or seek an in-person evaluation if the symptoms worsen or if the condition fails to improve as anticipated.  I provided 24 minutes of non-face-to-face interaction with this MYCHART visit including intake, same-day documentation, and chart review.   Tollie Eth, NP

## 2020-08-18 LAB — NOVEL CORONAVIRUS, NAA: SARS-CoV-2, NAA: NOT DETECTED

## 2020-08-18 NOTE — Progress Notes (Signed)
Raven Foster,   Your COVID results were negative. Are you feeling any better?

## 2020-08-24 ENCOUNTER — Telehealth (INDEPENDENT_AMBULATORY_CARE_PROVIDER_SITE_OTHER): Payer: BC Managed Care – PPO | Admitting: Nurse Practitioner

## 2020-08-24 ENCOUNTER — Telehealth: Payer: Self-pay

## 2020-08-24 ENCOUNTER — Encounter: Payer: Self-pay | Admitting: Nurse Practitioner

## 2020-08-24 ENCOUNTER — Ambulatory Visit (INDEPENDENT_AMBULATORY_CARE_PROVIDER_SITE_OTHER): Payer: BC Managed Care – PPO | Admitting: Nurse Practitioner

## 2020-08-24 DIAGNOSIS — Z1152 Encounter for screening for COVID-19: Secondary | ICD-10-CM

## 2020-08-24 DIAGNOSIS — Z20822 Contact with and (suspected) exposure to covid-19: Secondary | ICD-10-CM

## 2020-08-24 LAB — POCT INFLUENZA A/B
Influenza A, POC: NEGATIVE
Influenza B, POC: NEGATIVE

## 2020-08-24 NOTE — Telephone Encounter (Signed)
Pt called and said that when she came by to be swabbed for POCT flu and PCR COVID, that she was told the flu results would be done in a few minutes. She is wanting to know the results. She is also wanting to know if there will be a Rx for an atbx or steroid sent in for her. Please advise.

## 2020-08-24 NOTE — Progress Notes (Signed)
Pt stated took Covid test 2 weeks ago tested negative have not taking another as of today   Continued to have sinus issue  Fatigue, chills no fever Right ear pain, coughing OTC tylenol and dayquil

## 2020-08-24 NOTE — Progress Notes (Signed)
Flu test is negative. Will let you know results of COVID as soon as we get them back.

## 2020-08-24 NOTE — Patient Instructions (Addendum)
You have been scheduled for COVID testing at 11:00 today. Please call the office when you get here at [phone] and let them know you are here for testing. I will let you know what the results are and we will make decisions on further medications or needs at that time.    Over the counter medications that may be helpful for symptoms: . Guaifenesin 1200 mg extended release tabs twice daily, with plenty of water o For cough and congestion o Brand name: Mucinex   . Pseudoephedrine 30 mg, one or two tabs every 4 to 6 hours o For sinus congestion o Brand name: Sudafed o You must get this from the pharmacy counter.  . Oxymetazoline nasal spray each morning, one spray in each nostril, for NO MORE THAN 3 days  o For nasal and sinus congestion o Brand name: Afrin . Saline nasal spray or Saline Nasal Irrigation 3-5 times a day o For nasal and sinus congestion o Brand names: Ocean or AYR . Fluticasone nasal spray, one spray in each nostril, each morning after oxymetazoline and saline, if used o For nasal and sinus congestion o Brand name: Flonase . Warm salt water gargles  o For sore throat o Every few hours as needed . Alternate ibuprofen 400-600 mg and acetaminophen 1000 mg every 4-6 hours o For fever, body aches, headache o Brand names: Motrin or Advil and Tylenol . Dextromethorphan 12-hour cough version 30 mg every 12 hours  o For cough o Brand name: Delsym Stop all other cold medications for now (Nyquil, Dayquil, Tylenol Cold, Theraflu, etc) and other non-prescription cough/cold preparations. Many of these have the same ingredients listed above and could cause an overdose of medication.   General Instructions . Allow your body to rest . Drink PLENTY of fluids . Isolate yourself from everyone, even family, until test results have returned  If your COVID-19 test is positive . Then you ARE INFECTED and you can pass the virus to others . You must quarantine from others for a minimum of   o 10 days since symptoms started AND o You are fever free for 24 hours WITHOUT any medication to reduce fever AND o Your symptoms are improving . Do not go to the store or other public areas . Do not go around household members who are not known to be infected with COVID-19 . If you MUST leave you area of quarantine (example: go to a bathroom you share with others in your home), you must o Wear a mask o Wash your hands thoroughly o Wipe down any surfaces you touch . Do not share food, drinks, towels, or other items with other persons . Dispose of your own tissues, food containers, etc  Once you have recovered, please continue good preventive care measures, including:  . wearing a mask when in public . wash your hands frequently . avoid touching your face/nose/eyes . cover coughs/sneezes with the inside of your elbow . stay out of crowds . keep a 6 foot distance from others  Go to the nearest hospital emergency room if fever/cough/breathlessness are severe or illness seems like a threat to life.

## 2020-08-24 NOTE — Progress Notes (Signed)
Virtual Video Visit via MyChart Note- CONVERTED TO TELEPHONE DUE TO SOUND AND VIDEO NOT WORKING FROM PATIENTS END  I connected with  Raven Foster on 08/24/20 at  9:30 AM EST by the video enabled telemedicine application for , MyChart, and verified that I am speaking with the correct person using two identifiers.   I introduced myself as a Publishing rights manager with the practice. We discussed the limitations of evaluation and management by telemedicine and the availability of in person appointments. The patient expressed understanding and agreed to proceed.  Participating parties in this visit include: The patient and the nurse practitioner listed with shadowing nurse practitioner, Hyman Hopes. The patient is: At home I am: In the office  Subjective:    CC:  Chief Complaint  Patient presents with  . URI    HPI: Raven Foster is a 50 y.o. year old female presenting today via MyChart today for continuing symptoms of sinusitis and allergies. She was tested for COVID about 2 weeks ago and it was negative, but she has gone back to work since that time and had potential exposures. She reports this does not feel like her usual sinus infection symptoms.   Endorses whole body aches, chills, stabbing ear pain, congestion, sinus pain and pressure, cough, post nasal drip.   Denies fevers, chest pain, shortness of breath, loss of taste, loss of smell, dizziness.   She has been taking tylenol and dayquil.   Past medical history, Surgical history, Family history not pertinant except as noted below, Social history, Allergies, and medications have been entered into the medical record, reviewed, and corrections made.   Review of Systems:  All review of systems negative except what is listed in the HPI   Objective:    General:  Speaking clearly in complete sentences. Absent shortness of breath noted.   Alert and oriented x3.   Normal judgment.  Absent acute distress.   Impression and  Recommendations:    1. Encounter by telehealth for suspected COVID-19 Symptoms and presentation consistent with possible COVID-19 or flu. We will test today in office for both- patient on schedule at 11:00 with the nurse for swab. Possibility of post viral bacterial component also considered. Will await test results to make determination on antibiotic use.  OTC recommendations provided on AVS.  Monitor for worsening or severe symptoms and seek emergency evaluation if symptoms present.  Will notify of test results and make changes to plan of care as necessary.   Follow-up if symptoms worsen or fail to improve.    I discussed the assessment and treatment plan with the patient. The patient was provided an opportunity to ask questions and all were answered. The patient agreed with the plan and demonstrated an understanding of the instructions.   The patient was advised to call back or seek an in-person evaluation if the symptoms worsen or if the condition fails to improve as anticipated.  I provided 20 minutes of non-face-to-face interaction with this MYCHART visit including intake, same-day documentation, and chart review.   Tollie Eth, NP

## 2020-08-25 NOTE — Telephone Encounter (Signed)
Labs have been resulted by the provider and was seen by patient Raven Foster on 08/24/2020 8:19 PM

## 2020-08-26 LAB — SARS-COV-2, NAA 2 DAY TAT

## 2020-08-26 LAB — NOVEL CORONAVIRUS, NAA: SARS-CoV-2, NAA: DETECTED — AB

## 2020-08-26 NOTE — Progress Notes (Signed)
Patient is positive for COVID. Recommend quarantine for at least 10 days from the first day of most recent symptoms and until fever free for more than 24 hours and symptom improvement.

## 2020-08-31 ENCOUNTER — Encounter: Payer: Self-pay | Admitting: Nurse Practitioner

## 2020-08-31 ENCOUNTER — Telehealth (INDEPENDENT_AMBULATORY_CARE_PROVIDER_SITE_OTHER): Payer: BC Managed Care – PPO | Admitting: Nurse Practitioner

## 2020-08-31 DIAGNOSIS — J0141 Acute recurrent pansinusitis: Secondary | ICD-10-CM

## 2020-08-31 MED ORDER — PREDNISONE 20 MG PO TABS: 40.0000 mg | ORAL_TABLET | Freq: Every day | ORAL | 0 refills | Status: DC

## 2020-08-31 MED ORDER — AMOXICILLIN-POT CLAVULANATE 875-125 MG PO TABS
1.0000 | ORAL_TABLET | Freq: Two times a day (BID) | ORAL | 0 refills | Status: DC
Start: 2020-08-31 — End: 2020-09-01

## 2020-08-31 MED ORDER — GUAIFENESIN 300 MG/15ML PO SOLN: 300.0000 mg | ORAL | 2 refills | Status: DC | PRN

## 2020-08-31 NOTE — Patient Instructions (Signed)
The following information is provided as a general resource for ADULT patients only and does NOT take into account PREGNANCY, ALLERGIES, LIVER CONDITIONS, KIDNEY CONDITIONS, GASTROINTESTINAL CONDITIONS, OR PRESCRIPTION MEDICATION INTERACTIONS. Please be sure to ask your provider if the following are safe to take with your specific medical history, conditions, or current medication regimen if you are unsure.   Adult Basic Symptom Management for Sinusitis  Congestion: Guaifenesin (Mucinex)- follow directions on packaging with a maximum dose of 2400mg in a 24 hour period.  Pain/Fever: Ibuprofen 200mg - 400mg every 4-6 hours as needed (MAX 1200mg in a 24 hour period) Pain/Fever: Tylenol 500mg -1000mg every 6-8 hours as needed (MAX 3000mg in a 24 hour period)  Cough: Dextromethorphan (Delsym)- follow directions on packing with a maximum dose of 120mg in a 24 hour period.  Nasal Stuffiness: Saline nasal spray and/or Nettie Pot with sterile saline solution  Runny Nose: Fluticasone nasal spray (Flonase) OR Mometasone nasal spray (Nasonex) OR Triamcinolone Acetonide nasal spray (Nasacort)- follow directions on the packaging  Pain/Pressure: Warm washcloth to the face  Sore Throat: Warm salt water gargles  If you have allergies, you may also consider taking an oral antihistamine (like Zyrtec or Claritin) as these may also help with your symptoms.  **Many medications will have more than one ingredient, be sure you are reading the packaging carefully and not taking more than one dose of the same kind of medication at the same time or too close together. It is OK to use formulas that have all of the ingredients you want, but do not take them in a combined medication and as separate dose too close together. If you have any questions, the pharmacist will be happy to help you decide what is safe.    

## 2020-08-31 NOTE — Progress Notes (Signed)
Virtual Video Visit via MyChart Note- CONVERTED TO TELEPHONE VISIT  I connected with  Debbora Lacrosse on 08/31/20 at 10:50 AM EST by the video enabled telemedicine application for , MyChart, and verified that I am speaking with the correct person using two identifiers.   I introduced myself as a Publishing rights manager with the practice. We discussed the limitations of evaluation and management by telemedicine and the availability of in person appointments. The patient expressed understanding and agreed to proceed.  Participating parties in this visit include: The patient and the nurse practitioner listed.  The patient is: At home I am: In the office  Subjective:    CC:  Chief Complaint  Patient presents with  . Sinusitis    Not improving    HPI: Raven Foster is a 50 y.o. year old female presenting today via MyChart today for continued sinus and upper respiratory symptoms that have been present since mid January. She has been tested positive for COVID on 08/24/2020, at that time flu results were negative. At one point she was showing some improvement, but her symptoms have worsened again.   She endorses thick discolored drainage, frequent cough, headache, sinus pain and pressure- worse on the left side, sore throat, fatigue.   She denies fevers, shortness of breath, aching, chest pain.   She has not been vaccinated for COVID and does not wish to be.  Past medical history, Surgical history, Family history not pertinant except as noted below, Social history, Allergies, and medications have been entered into the medical record, reviewed, and corrections made.   Review of Systems:  All review of systems negative except what is listed in the HPI  Objective:    General:  Speaking clearly in complete sentences. Absent shortness of breath noted.   Alert and oriented x3.   Normal judgment.  Absent acute distress. She is audibly congested  Impression and Recommendations:    1.  Acute recurrent pansinusitis Prolonged sinusitis symptoms after COVID diagnosis. Most likely bacterial component present at this point causing the significant sinus pain and pressure- it is possible that due to the length of time symptoms have been present she has also had a bacterial component in conjunction with COVID. Will begin treatment with augmentin and a steroid burst. Guaifenesin also sent to pharmacy.  Recommend increased rest and hydration and follow-up if symptoms worsen or fail to improve.  - amoxicillin-clavulanate (AUGMENTIN) 875-125 MG tablet; Take 1 tablet by mouth 2 (two) times daily.  Dispense: 14 tablet; Refill: 0 - predniSONE (DELTASONE) 20 MG tablet; Take 2 tablets (40 mg total) by mouth daily with breakfast.  Dispense: 10 tablet; Refill: 0     Follow-up if symptoms worsen or fail to improve.    I discussed the assessment and treatment plan with the patient. The patient was provided an opportunity to ask questions and all were answered. The patient agreed with the plan and demonstrated an understanding of the instructions.   The patient was advised to call back or seek an in-person evaluation if the symptoms worsen or if the condition fails to improve as anticipated.  I provided 20 minutes of non-face-to-face interaction with this MYCHART visit including intake, same-day documentation, and chart review.   Tollie Eth, NP

## 2020-09-01 ENCOUNTER — Telehealth: Payer: Self-pay

## 2020-09-01 ENCOUNTER — Encounter: Payer: Self-pay | Admitting: Osteopathic Medicine

## 2020-09-01 MED ORDER — ONDANSETRON HCL 4 MG PO TABS: 4.0000 mg | ORAL_TABLET | Freq: Three times a day (TID) | ORAL | 0 refills | Status: AC | PRN

## 2020-09-01 MED ORDER — DOXYCYCLINE HYCLATE 100 MG PO TABS: 100.0000 mg | ORAL_TABLET | Freq: Two times a day (BID) | ORAL | 0 refills | Status: DC

## 2020-09-01 NOTE — Telephone Encounter (Signed)
Alt sent See mychart message to pt

## 2020-09-01 NOTE — Telephone Encounter (Signed)
Raven Foster called and left a message stating the Augmentin is causing terrible vomiting. She would like to switch the antibiotic. Please advise.

## 2020-09-10 ENCOUNTER — Encounter: Payer: Self-pay | Admitting: Osteopathic Medicine

## 2020-09-10 ENCOUNTER — Other Ambulatory Visit: Payer: Self-pay

## 2020-09-10 ENCOUNTER — Ambulatory Visit: Payer: BC Managed Care – PPO | Admitting: Osteopathic Medicine

## 2020-09-10 VITALS — BP 102/68 | HR 72 | Temp 97.2°F | Wt 118.1 lb

## 2020-09-10 DIAGNOSIS — F419 Anxiety disorder, unspecified: Secondary | ICD-10-CM | POA: Diagnosis not present

## 2020-09-10 DIAGNOSIS — Z13220 Encounter for screening for lipoid disorders: Secondary | ICD-10-CM

## 2020-09-10 DIAGNOSIS — Z1231 Encounter for screening mammogram for malignant neoplasm of breast: Secondary | ICD-10-CM | POA: Diagnosis not present

## 2020-09-10 DIAGNOSIS — R41840 Attention and concentration deficit: Secondary | ICD-10-CM

## 2020-09-10 DIAGNOSIS — Z0000 Encounter for general adult medical examination without abnormal findings: Secondary | ICD-10-CM | POA: Diagnosis not present

## 2020-09-10 MED ORDER — ATOMOXETINE HCL 10 MG PO CAPS: 10.0000 mg | ORAL_CAPSULE | Freq: Two times a day (BID) | ORAL | 3 refills | Status: AC

## 2020-09-10 MED ORDER — ESCITALOPRAM OXALATE 5 MG PO TABS: 10.0000 mg | ORAL_TABLET | Freq: Every day | ORAL | 0 refills | Status: DC

## 2020-09-10 NOTE — Progress Notes (Signed)
Raven Foster is a 50 y.o. female who presents to  [facility]  today, 09/10/20, seeking care for the following:  . Annual physical  . Follow-up mental health: better on the lexapro, still feel ssome room for improvement and would like to try increasing       ASSESSMENT & PLAN with other pertinent findings:  The primary encounter diagnosis was Annual physical exam. Diagnoses of Lipid screening, Breast cancer screening by mammogram, Anxiety, and Inattention were also pertinent to this visit.   --> increase Lexapro to 10 mg and let me know how it's working! MyChart message set to send later to check in on this   Patient Instructions  General Preventive Care  Most recent routine screening labs: reviewed from [facility] etc, will get lipid panel here whenever .   Blood pressure goal 130/80 or less.   Tobacco: don't!   Alcohol: responsible moderation is ok for most adults - if you have concerns about your alcohol intake, please talk to me!   Exercise: as tolerated to reduce risk of cardiovascular disease and diabetes. Strength training will also prevent osteoporosis.   Mental health: if need for mental health care (medicines, counseling, other), or concerns about moods, please let me know!   Sexual / Reproductive health: if need for STD testing, or if concerns with libido/pain problems, please let me know!   Advanced Directive: Living Will and/or Healthcare Power of Attorney recommended for all adults, regardless of age or health.  Vaccines  Flu vaccine: for almost everyone, every fall.   Shingles vaccine: after age 31.   Pneumonia vaccines: repeat after age 74  Tetanus booster: every 10 years - due 2029  COVID vaccine: STRONGLY RECOMMENDED  Cancer screenings   Colon cancer screening: for everyone age 66-75. Per GI   Breast cancer screening: mammogram ordered  Cervical cancer screening: Pap every 5 years if  normal  Lung cancer screening: CT chest every year for those aged 53 to 80 years who have a 20 pack-year smoking history and currently smoke or have quit within the past 15 years  Infection screenings  . HIV: recommended screening at least once age 74-65 . Gonorrhea/Chlamydia: screening as needed, . Hepatitis C: recommended once for everyone age 25-75 . TB: certain at-risk populations, or depending on work requirements and/or travel history Other . Bone Density Test: recommended for women at age 24   Orders Placed This Encounter  Procedures  . MM 3D SCREEN BREAST BILATERAL  . Lipid panel    Meds ordered this encounter  Medications  . escitalopram (LEXAPRO) 5 MG tablet    Sig: Take 2 tablets (10 mg total) by mouth daily.    Dispense:  90 tablet    Refill:  0  . atomoxetine (STRATTERA) 10 MG capsule    Sig: Take 1 capsule (10 mg total) by mouth 2 (two) times daily with a meal.    Dispense:  180 capsule    Refill:  3     See below for relevant physical exam findings  See below for recent lab and imaging results reviewed  Medications, allergies, PMH, PSH, SocH, FamH reviewed below    Follow-up instructions: Return in about 1 year (around 09/10/2021) for [employer] - SEE [place] SOONER IF NEEDED.  Exam:  BP 102/68 (BP Location: Left Arm, Patient Position: Sitting, Cuff Size: Normal)   Pulse 72   Temp (!) 97.2 F (36.2 C) (Oral)   Wt 118 lb 1.9 oz (53.6 kg)   BMI 20.60 kg/m   Constitutional: VS see above. General Appearance: alert, well-developed, well-nourished, NAD  Neck: No masses, trachea midline.   Respiratory: Normal respiratory effort. no wheeze, no rhonchi, no rales  Cardiovascular: S1/S2 normal, no murmur, no rub/gallop auscultated. RRR.   Musculoskeletal: Gait normal. Symmetric and independent movement of all extremities  Abdominal: non-tender, non-distended, no appreciable  organomegaly, neg Murphy's, BS WNLx4  Neurological: Normal balance/coordination. No tremor.  Skin: warm, dry, intact.   Psychiatric: Normal judgment/insight. Normal mood and affect. Oriented x3.   No outpatient medications have been marked as taking for the 09/10/20 encounter (Office Visit) with Sunnie Nielsen, DO.    Allergies  Allergen Reactions  . Diflucan [Fluconazole] Itching  . Metronidazole Itching  . Codeine Nausea And Vomiting  . Erythromycin Nausea Only    Patient Active Problem List   Diagnosis Date Noted  . Rib pain on right side 04/09/2019  . Anxiety 04/09/2019  . Inattention 04/09/2019  . Declined smoking cessation 06/15/2018  . Dysphagia 06/15/2018  . Globus sensation 06/15/2018  . Synovitis of right third PIP 01/03/2018  . Tobacco use disorder 09/12/2017  . Hypercalcemia 09/12/2017  . Pulmonary emphysema (HCC) 09/12/2017  . Elevated cholesterol 09/12/2017  . Perimenopausal 09/08/2017  . Chronic vasomotor rhinitis 09/08/2017  . OAB (overactive bladder) 09/08/2017  . Nephrolithiasis 09/08/2017  . IUD (intrauterine device) in place 09/08/2017    Family History  Problem Relation Age of Onset  . Hypertension Mother   . COPD Mother   . Hypertension Father   . Thyroid disease Sister     Social History   Tobacco Use  Smoking Status Current Every Day Smoker  . Packs/day: 0.50  . Years: 30.00  . Pack years: 15.00  . Types: Cigarettes  Smokeless Tobacco Current User    Past Surgical History:  Procedure Laterality Date  . HERNIA REPAIR    . INTRAUTERINE DEVICE (IUD) INSERTION    . SEPTOPLASTY    . TONSILLECTOMY      Immunization History  Administered Date(s) Administered  . Pneumococcal Polysaccharide-23 09/08/2017  . Tdap 12/08/2017    Recent Results (from the past 2160 hour(s))  Novel Coronavirus, NAA ([facility])     Status: None   Collection Time: 08/14/20 11:37 AM   Specimen: Saline   Saline  Result Value Ref Range   SARS-CoV-2,  NAA Not Detected Not Detected    Comment: This nucleic acid amplification test was developed and its performance characteristics determined by [employer]. Nucleic acid amplification tests include RT-PCR and TMA. This test has not been FDA cleared or approved. This test has been authorized by FDA under an Emergency Use Authorization (EUA). This test is only authorized for the duration of time the declaration that circumstances exist justifying the authorization of the emergency use of in vitro diagnostic tests for detection of SARS-CoV-2 virus and/or diagnosis of COVID-19 infection under section 564(b)(1) of the Act, 21 U.S.C. 254YHC-6(C) (1), unless the authorization is terminated or revoked sooner. When diagnostic testing is negative, the possibility of a false negative result should be considered in the context of a patient's recent exposures and the presence of clinical signs and symptoms consistent with COVID-19. An individual without symptoms of COVID-19 and who is not shedding SARS-CoV-2 virus wo uld expect to  have a negative (not detected) result in this assay.   Novel Coronavirus, NAA ([facility])     Status: Abnormal   Collection Time: 08/24/20 12:00 AM   Specimen: Nasal Swab; Nasopharyngeal(NP) swabs in vial transport medium   Nasopharynge  Result Value Ref Range   SARS-CoV-2, NAA Detected (A) Not Detected    Comment: Patients who have a positive COVID-19 test result may now have treatment options. Treatment options are available for patients with mild to moderate symptoms and for hospitalized patients. Visit our website at [email or web address] for resources and information. This nucleic acid amplification test was developed and its performance characteristics determined by [employer]. Nucleic acid amplification tests include RT-PCR and TMA. This test has not been FDA cleared or approved. This test has been authorized by FDA under an Emergency  Use Authorization (EUA). This test is only authorized for the duration of time the declaration that circumstances exist justifying the authorization of the emergency use of in vitro diagnostic tests for detection of SARS-CoV-2 virus and/or diagnosis of COVID-19 infection under section 564(b)(1) of the Act, 21 U.S.C. 791TAV-6(P) (1), unless the authorization is terminated or revoked sooner. When diagnostic testing is negativ e, the possibility of a false negative result should be considered in the context of a patient's recent exposures and the presence of clinical signs and symptoms consistent with COVID-19. An individual without symptoms of COVID-19 and who is not shedding SARS-CoV-2 virus would expect to have a negative (not detected) result in this assay.   SARS-COV-2, NAA 2 DAY TAT     Status: None   Collection Time: 08/24/20 12:00 AM   Nasopharynge  Result Value Ref Range   SARS-CoV-2, NAA 2 DAY TAT Performed   POCT Influenza A/B     Status: None   Collection Time: 08/24/20 11:40 AM  Result Value Ref Range   Influenza A, POC Negative Negative   Influenza B, POC Negative Negative    No results found.     All questions at time of visit were answered - patient instructed to contact office with any additional concerns or updates. ER/RTC precautions were reviewed with the patient as applicable.   Please note: manual typing as well as voice recognition software may have been used to produce this document - typos may escape review. Please contact Dr. Lyn Hollingshead for any needed clarifications.

## 2020-09-10 NOTE — Patient Instructions (Signed)
General Preventive Care  Most recent routine screening labs: reviewed from [facility] etc, will get lipid panel here whenever .   Blood pressure goal 130/80 or less.   Tobacco: don't!   Alcohol: responsible moderation is ok for most adults - if you have concerns about your alcohol intake, please talk to me!   Exercise: as tolerated to reduce risk of cardiovascular disease and diabetes. Strength training will also prevent osteoporosis.   Mental health: if need for mental health care (medicines, counseling, other), or concerns about moods, please let me know!   Sexual / Reproductive health: if need for STD testing, or if concerns with libido/pain problems, please let me know!   Advanced Directive: Living Will and/or Healthcare Power of Attorney recommended for all adults, regardless of age or health.  Vaccines  Flu vaccine: for almost everyone, every fall.   Shingles vaccine: after age 36.   Pneumonia vaccines: repeat after age 76  Tetanus booster: every 10 years - due 2029  COVID vaccine: STRONGLY RECOMMENDED  Cancer screenings   Colon cancer screening: for everyone age 59-75. Per GI   Breast cancer screening: mammogram ordered  Cervical cancer screening: Pap every 5 years if normal  Lung cancer screening: CT chest every year for those aged 34 to 80 years who have a 20 pack-year smoking history and currently smoke or have quit within the past 15 years  Infection screenings  . HIV: recommended screening at least once age 55-65 . Gonorrhea/Chlamydia: screening as needed, . Hepatitis C: recommended once for everyone age 23-75 . TB: certain at-risk populations, or depending on work requirements and/or travel history Other . Bone Density Test: recommended for women at age 39

## 2020-10-13 ENCOUNTER — Other Ambulatory Visit: Payer: Self-pay | Admitting: Osteopathic Medicine

## 2021-01-20 ENCOUNTER — Other Ambulatory Visit: Payer: Self-pay | Admitting: Osteopathic Medicine

## 2021-10-27 ENCOUNTER — Telehealth: Payer: Self-pay | Admitting: Osteopathic Medicine

## 2021-10-27 DIAGNOSIS — F419 Anxiety disorder, unspecified: Secondary | ICD-10-CM

## 2021-10-27 DIAGNOSIS — R41840 Attention and concentration deficit: Secondary | ICD-10-CM

## 2021-10-27 NOTE — Telephone Encounter (Signed)
Routing to covering provider.  °

## 2021-10-27 NOTE — Telephone Encounter (Signed)
Patient has not been seen in our office for more than 1 year. Please schedule a transfer of care appointment to establish a new PCP. ?

## 2021-10-27 NOTE — Telephone Encounter (Signed)
Please review note from covering provider. Thanks in advance! ?

## 2021-10-28 NOTE — Telephone Encounter (Signed)
Patient has a TOC appointment with Dr Zigmund Daniel next available on 12/20/2021 and was wondering if she could have a refill up until her appointment date. AM ?

## 2021-10-28 NOTE — Telephone Encounter (Signed)
Forwarding to covering provider. Please review the previous note. Thanks.  ?

## 2021-11-02 NOTE — Telephone Encounter (Signed)
Task completed. Left a detailed vm msg for patient regarding both provider's recommendation. Patient informed to call throughout the week to check if there are any cancellations for a sooner appointment. Direct call back info provided.  ?

## 2021-11-02 NOTE — Telephone Encounter (Signed)
Pt called back to the office Highly upset because we will not refill her Strattera since she has not been seen since February 2022. She states this is B.S. while cussing on the phone and thinks we should be able to call her something in. She also states I can cancel her upcoming appointment in May because she does not want to keep her appointment to transfer her care since we would rather make her be without her Strattera. ( I have canceled pts upcoming appt) ?She states it is not her fault that Dr.Alexander left and that if Dr.Alexander was here, she would call it in for her. ?She states that its not a good look that we have doctors that keep leaving our office. She said she will be leaving and going to another office for her care. ?

## 2021-12-20 ENCOUNTER — Ambulatory Visit: Payer: BC Managed Care – PPO | Admitting: Family Medicine

## 2022-07-15 DIAGNOSIS — Z7689 Persons encountering health services in other specified circumstances: Secondary | ICD-10-CM | POA: Diagnosis not present

## 2022-07-15 DIAGNOSIS — F429 Obsessive-compulsive disorder, unspecified: Secondary | ICD-10-CM | POA: Diagnosis not present

## 2022-07-15 DIAGNOSIS — K22719 Barrett's esophagus with dysplasia, unspecified: Secondary | ICD-10-CM | POA: Diagnosis not present

## 2022-07-15 DIAGNOSIS — F909 Attention-deficit hyperactivity disorder, unspecified type: Secondary | ICD-10-CM | POA: Diagnosis not present

## 2022-07-15 DIAGNOSIS — F411 Generalized anxiety disorder: Secondary | ICD-10-CM | POA: Diagnosis not present

## 2022-07-15 DIAGNOSIS — R634 Abnormal weight loss: Secondary | ICD-10-CM | POA: Diagnosis not present

## 2022-07-15 DIAGNOSIS — R1012 Left upper quadrant pain: Secondary | ICD-10-CM | POA: Diagnosis not present

## 2022-07-15 DIAGNOSIS — R519 Headache, unspecified: Secondary | ICD-10-CM | POA: Diagnosis not present

## 2022-07-15 DIAGNOSIS — K5900 Constipation, unspecified: Secondary | ICD-10-CM | POA: Diagnosis not present

## 2022-07-21 DIAGNOSIS — F411 Generalized anxiety disorder: Secondary | ICD-10-CM | POA: Diagnosis not present

## 2022-07-21 DIAGNOSIS — F429 Obsessive-compulsive disorder, unspecified: Secondary | ICD-10-CM | POA: Diagnosis not present

## 2022-07-21 DIAGNOSIS — R1012 Left upper quadrant pain: Secondary | ICD-10-CM | POA: Diagnosis not present

## 2022-07-21 DIAGNOSIS — K5900 Constipation, unspecified: Secondary | ICD-10-CM | POA: Diagnosis not present

## 2022-07-21 DIAGNOSIS — R634 Abnormal weight loss: Secondary | ICD-10-CM | POA: Diagnosis not present

## 2022-07-21 DIAGNOSIS — F909 Attention-deficit hyperactivity disorder, unspecified type: Secondary | ICD-10-CM | POA: Diagnosis not present

## 2022-07-21 DIAGNOSIS — K22719 Barrett's esophagus with dysplasia, unspecified: Secondary | ICD-10-CM | POA: Diagnosis not present

## 2022-07-21 DIAGNOSIS — R519 Headache, unspecified: Secondary | ICD-10-CM | POA: Diagnosis not present

## 2022-08-09 DIAGNOSIS — Z1231 Encounter for screening mammogram for malignant neoplasm of breast: Secondary | ICD-10-CM | POA: Diagnosis not present

## 2022-08-09 DIAGNOSIS — R92333 Mammographic heterogeneous density, bilateral breasts: Secondary | ICD-10-CM | POA: Diagnosis not present

## 2022-09-15 DIAGNOSIS — F909 Attention-deficit hyperactivity disorder, unspecified type: Secondary | ICD-10-CM | POA: Diagnosis not present

## 2022-09-15 DIAGNOSIS — F411 Generalized anxiety disorder: Secondary | ICD-10-CM | POA: Diagnosis not present

## 2023-04-17 DIAGNOSIS — Z13220 Encounter for screening for lipoid disorders: Secondary | ICD-10-CM | POA: Diagnosis not present

## 2023-04-17 DIAGNOSIS — Z0000 Encounter for general adult medical examination without abnormal findings: Secondary | ICD-10-CM | POA: Diagnosis not present

## 2023-04-26 DIAGNOSIS — H5213 Myopia, bilateral: Secondary | ICD-10-CM | POA: Diagnosis not present

## 2023-11-15 DIAGNOSIS — F411 Generalized anxiety disorder: Secondary | ICD-10-CM | POA: Diagnosis not present

## 2023-11-15 DIAGNOSIS — F909 Attention-deficit hyperactivity disorder, unspecified type: Secondary | ICD-10-CM | POA: Diagnosis not present

## 2023-11-15 DIAGNOSIS — H9313 Tinnitus, bilateral: Secondary | ICD-10-CM | POA: Diagnosis not present

## 2023-11-22 DIAGNOSIS — Z124 Encounter for screening for malignant neoplasm of cervix: Secondary | ICD-10-CM | POA: Diagnosis not present
